# Patient Record
Sex: Female | Born: 1970 | Race: Black or African American | Hispanic: No | Marital: Single | Smoking: Never smoker
Health system: Southern US, Community
[De-identification: ages and names within clinical notes are randomized; demographics above are authoritative.]

## PROBLEM LIST (undated history)

## (undated) DIAGNOSIS — D219 Benign neoplasm of connective and other soft tissue, unspecified: Secondary | ICD-10-CM

## (undated) DIAGNOSIS — K5900 Constipation, unspecified: Secondary | ICD-10-CM

---

## 1999-10-13 DIAGNOSIS — R002 Palpitations: Secondary | ICD-10-CM

## 1999-10-13 HISTORY — DX: Palpitations: R00.2

## 2006-01-19 ENCOUNTER — Encounter: Admission: RE | Admit: 2006-01-19 | Discharge: 2006-01-19 | Payer: Self-pay

## 2007-04-13 ENCOUNTER — Encounter: Admission: RE | Admit: 2007-04-13 | Discharge: 2007-04-13 | Payer: Self-pay | Admitting: Internal Medicine

## 2008-08-16 ENCOUNTER — Encounter: Admission: RE | Admit: 2008-08-16 | Discharge: 2008-08-16 | Payer: Self-pay | Admitting: Internal Medicine

## 2009-09-15 ENCOUNTER — Emergency Department (HOSPITAL_COMMUNITY): Admission: EM | Admit: 2009-09-15 | Discharge: 2009-09-15 | Payer: Self-pay | Admitting: Emergency Medicine

## 2009-11-18 ENCOUNTER — Other Ambulatory Visit: Admission: RE | Admit: 2009-11-18 | Discharge: 2009-11-18 | Payer: Self-pay | Admitting: Internal Medicine

## 2010-03-23 ENCOUNTER — Emergency Department (HOSPITAL_COMMUNITY): Admission: EM | Admit: 2010-03-23 | Discharge: 2010-03-23 | Payer: Self-pay | Admitting: Emergency Medicine

## 2010-07-25 ENCOUNTER — Emergency Department (HOSPITAL_COMMUNITY): Admission: EM | Admit: 2010-07-25 | Discharge: 2010-07-25 | Payer: Self-pay | Admitting: Emergency Medicine

## 2010-12-22 ENCOUNTER — Emergency Department (HOSPITAL_COMMUNITY): Payer: Self-pay

## 2010-12-22 ENCOUNTER — Emergency Department (HOSPITAL_COMMUNITY)
Admission: EM | Admit: 2010-12-22 | Discharge: 2010-12-23 | Disposition: A | Discharge status: Home / Self Care | Payer: Self-pay | Attending: Emergency Medicine | Admitting: Emergency Medicine

## 2010-12-22 DIAGNOSIS — R0989 Other specified symptoms and signs involving the circulatory and respiratory systems: Secondary | ICD-10-CM | POA: Insufficient documentation

## 2010-12-22 DIAGNOSIS — R0609 Other forms of dyspnea: Secondary | ICD-10-CM | POA: Insufficient documentation

## 2010-12-22 DIAGNOSIS — D649 Anemia, unspecified: Secondary | ICD-10-CM | POA: Insufficient documentation

## 2010-12-22 DIAGNOSIS — F411 Generalized anxiety disorder: Secondary | ICD-10-CM | POA: Insufficient documentation

## 2010-12-22 DIAGNOSIS — R002 Palpitations: Secondary | ICD-10-CM | POA: Insufficient documentation

## 2010-12-22 LAB — DIFFERENTIAL
Basophils Absolute: 0 10*3/uL (ref 0.0–0.1)
Eosinophils Absolute: 0.1 10*3/uL (ref 0.0–0.7)
Monocytes Relative: 8 % (ref 3–12)
Neutrophils Relative %: 44 % (ref 43–77)

## 2010-12-22 LAB — BASIC METABOLIC PANEL
BUN: 12 mg/dL (ref 6–23)
Calcium: 8.9 mg/dL (ref 8.4–10.5)
Chloride: 105 mEq/L (ref 96–112)
Creatinine, Ser: 0.62 mg/dL (ref 0.4–1.2)
GFR calc non Af Amer: >60 mL/min (ref >60)
Potassium: 3.8 mEq/L (ref 3.5–5.1)
Sodium: 136 mEq/L (ref 135–145)

## 2010-12-22 LAB — URINALYSIS, ROUTINE W REFLEX MICROSCOPIC
Bilirubin Urine: NEGATIVE
Glucose, UA: NEGATIVE mg/dL
Nitrite: NEGATIVE
Protein, ur: NEGATIVE mg/dL
Urobilinogen, UA: 1 mg/dL (ref 0.0–1.0)
pH: 7 (ref 5.0–8.0)

## 2010-12-22 LAB — POCT CARDIAC MARKERS
CKMB, poc: <1 ng/mL — ABNORMAL LOW (ref 1.0–8.0)
Myoglobin, poc: 30.3 ng/mL (ref 12–200)

## 2010-12-22 LAB — RAPID URINE DRUG SCREEN, HOSP PERFORMED: Cocaine: NOT DETECTED

## 2010-12-22 LAB — CBC
MCH: 26.7 pg (ref 26.0–34.0)
Platelets: 278 10*3/uL (ref 150–400)
RDW: 16 % — ABNORMAL HIGH (ref 11.5–15.5)
WBC: 4.7 10*3/uL (ref 4.0–10.5)

## 2010-12-25 LAB — CBC
Hemoglobin: 10.7 g/dL — ABNORMAL LOW (ref 12.0–15.0)
Platelets: 345 10*3/uL (ref 150–400)

## 2010-12-25 LAB — BASIC METABOLIC PANEL
CO2: 27 mEq/L (ref 19–32)
Calcium: 9.4 mg/dL (ref 8.4–10.5)
Chloride: 109 mEq/L (ref 96–112)
GFR calc Af Amer: >60 mL/min (ref >60)
Glucose, Bld: 92 mg/dL (ref 70–99)
Potassium: 4.4 mEq/L (ref 3.5–5.1)
Sodium: 139 mEq/L (ref 135–145)

## 2010-12-25 LAB — DIFFERENTIAL
Basophils Absolute: 0 10*3/uL (ref 0.0–0.1)
Eosinophils Absolute: 0.3 10*3/uL (ref 0.0–0.7)
Lymphocytes Relative: 47 % — ABNORMAL HIGH (ref 12–46)
Monocytes Absolute: 0.5 10*3/uL (ref 0.1–1.0)
Monocytes Relative: 10 % (ref 3–12)
Neutro Abs: 2.1 10*3/uL (ref 1.7–7.7)
Neutrophils Relative %: 38 % — ABNORMAL LOW (ref 43–77)

## 2010-12-25 LAB — URINALYSIS, ROUTINE W REFLEX MICROSCOPIC
Glucose, UA: NEGATIVE mg/dL
Protein, ur: NEGATIVE mg/dL
Urobilinogen, UA: 1 mg/dL (ref 0.0–1.0)
pH: 8 (ref 5.0–8.0)

## 2010-12-25 LAB — POCT PREGNANCY, URINE: Preg Test, Ur: NEGATIVE

## 2010-12-29 LAB — POCT CARDIAC MARKERS
CKMB, poc: <1 ng/mL — ABNORMAL LOW (ref 1.0–8.0)
Myoglobin, poc: 24.9 ng/mL (ref 12–200)
Troponin i, poc: <0.05 ng/mL (ref 0.00–0.09)

## 2010-12-29 LAB — POCT I-STAT, CHEM 8
Calcium, Ion: 1.22 mmol/L (ref 1.12–1.32)
Glucose, Bld: 74 mg/dL (ref 70–99)
HCT: 34 % — ABNORMAL LOW (ref 36.0–46.0)
Hemoglobin: 11.6 g/dL — ABNORMAL LOW (ref 12.0–15.0)
Sodium: 140 mEq/L (ref 135–145)
TCO2: 23 mmol/L (ref 0–100)

## 2010-12-29 LAB — CBC
Hemoglobin: 10.8 g/dL — ABNORMAL LOW (ref 12.0–15.0)
MCV: 85.9 fL (ref 78.0–100.0)
Platelets: 262 10*3/uL (ref 150–400)
RBC: 3.69 MIL/uL — ABNORMAL LOW (ref 3.87–5.11)
RDW: 16.9 % — ABNORMAL HIGH (ref 11.5–15.5)

## 2010-12-29 LAB — DIFFERENTIAL
Basophils Relative: 1 % (ref 0–1)
Eosinophils Relative: 5 % (ref 0–5)
Monocytes Relative: 11 % (ref 3–12)
Neutrophils Relative %: 42 % — ABNORMAL LOW (ref 43–77)

## 2011-01-13 LAB — HIV ANTIBODY (ROUTINE TESTING W REFLEX): HIV: NONREACTIVE

## 2011-01-13 LAB — DIFFERENTIAL
Eosinophils Absolute: 0.2 10*3/uL (ref 0.0–0.7)
Eosinophils Relative: 4 % (ref 0–5)
Lymphs Abs: 1.4 10*3/uL (ref 0.7–4.0)
Monocytes Relative: 12 % (ref 3–12)

## 2011-01-13 LAB — PREGNANCY, URINE: Preg Test, Ur: NEGATIVE

## 2011-01-13 LAB — COMPREHENSIVE METABOLIC PANEL
AST: 21 U/L (ref 0–37)
Alkaline Phosphatase: 41 U/L (ref 39–117)
BUN: 11 mg/dL (ref 6–23)
Creatinine, Ser: 0.69 mg/dL (ref 0.4–1.2)
Glucose, Bld: 90 mg/dL (ref 70–99)
Sodium: 134 mEq/L — ABNORMAL LOW (ref 135–145)

## 2011-01-13 LAB — CBC
Hemoglobin: 11.6 g/dL — ABNORMAL LOW (ref 12.0–15.0)
MCHC: 34 g/dL (ref 30.0–36.0)
MCV: 87.4 fL (ref 78.0–100.0)
RDW: 15.2 % (ref 11.5–15.5)

## 2011-01-13 LAB — URINALYSIS, ROUTINE W REFLEX MICROSCOPIC
Glucose, UA: NEGATIVE mg/dL
Hgb urine dipstick: NEGATIVE
Specific Gravity, Urine: 1.022 (ref 1.005–1.030)

## 2011-01-13 LAB — TSH: TSH: 0.745 u[IU]/mL (ref 0.350–4.500)

## 2011-01-13 LAB — URINE CULTURE
Colony Count: NO GROWTH
Culture: NO GROWTH

## 2011-01-13 LAB — T4, FREE: Free T4: 1.03 ng/dL (ref 0.80–1.80)

## 2011-02-20 ENCOUNTER — Other Ambulatory Visit: Payer: Self-pay | Admitting: Internal Medicine

## 2011-02-20 DIAGNOSIS — N63 Unspecified lump in unspecified breast: Secondary | ICD-10-CM

## 2011-02-26 ENCOUNTER — Other Ambulatory Visit: Payer: Self-pay

## 2019-07-21 ENCOUNTER — Observation Stay (HOSPITAL_COMMUNITY)
Admission: EM | Admit: 2019-07-21 | Discharge: 2019-07-22 | Disposition: A | Discharge status: Home / Self Care | Payer: BC Managed Care – PPO | Attending: Internal Medicine | Admitting: Internal Medicine

## 2019-07-21 ENCOUNTER — Encounter (HOSPITAL_COMMUNITY): Payer: Self-pay | Admitting: *Deleted

## 2019-07-21 ENCOUNTER — Other Ambulatory Visit: Payer: Self-pay

## 2019-07-21 DIAGNOSIS — N920 Excessive and frequent menstruation with regular cycle: Secondary | ICD-10-CM

## 2019-07-21 DIAGNOSIS — K5900 Constipation, unspecified: Secondary | ICD-10-CM | POA: Insufficient documentation

## 2019-07-21 DIAGNOSIS — D649 Anemia, unspecified: Secondary | ICD-10-CM | POA: Diagnosis not present

## 2019-07-21 DIAGNOSIS — Z20828 Contact with and (suspected) exposure to other viral communicable diseases: Secondary | ICD-10-CM | POA: Diagnosis not present

## 2019-07-21 DIAGNOSIS — D259 Leiomyoma of uterus, unspecified: Secondary | ICD-10-CM | POA: Diagnosis not present

## 2019-07-21 DIAGNOSIS — R011 Cardiac murmur, unspecified: Secondary | ICD-10-CM | POA: Diagnosis not present

## 2019-07-21 DIAGNOSIS — K5909 Other constipation: Secondary | ICD-10-CM

## 2019-07-21 HISTORY — DX: Constipation, unspecified: K59.00

## 2019-07-21 HISTORY — DX: Benign neoplasm of connective and other soft tissue, unspecified: D21.9

## 2019-07-21 LAB — VITAMIN B12: Vitamin B-12: 257 pg/mL (ref 180–914)

## 2019-07-21 LAB — COMPREHENSIVE METABOLIC PANEL
ALT: 12 U/L (ref 0–44)
AST: 15 U/L (ref 15–41)
Albumin: 4.1 g/dL (ref 3.5–5.0)
Alkaline Phosphatase: 30 U/L — ABNORMAL LOW (ref 38–126)
Anion gap: 10 (ref 5–15)
BUN: 9 mg/dL (ref 6–20)
CO2: 18 mmol/L — ABNORMAL LOW (ref 22–32)
Calcium: 9.3 mg/dL (ref 8.9–10.3)
Chloride: 109 mmol/L (ref 98–111)
Creatinine, Ser: 0.62 mg/dL (ref 0.44–1.00)
GFR calc Af Amer: >60 mL/min (ref >60)
GFR calc non Af Amer: >60 mL/min (ref >60)
Glucose, Bld: 89 mg/dL (ref 70–99)
Potassium: 3.6 mmol/L (ref 3.5–5.1)
Sodium: 137 mmol/L (ref 135–145)
Total Bilirubin: 0.2 mg/dL — ABNORMAL LOW (ref 0.3–1.2)
Total Protein: 7.3 g/dL (ref 6.5–8.1)

## 2019-07-21 LAB — I-STAT BETA HCG BLOOD, ED (MC, WL, AP ONLY): I-stat hCG, quantitative: <5 m[IU]/mL (ref <5)

## 2019-07-21 LAB — RETICULOCYTES
Immature Retic Fract: 19.9 % — ABNORMAL HIGH (ref 2.3–15.9)
RBC.: 3.5 MIL/uL — ABNORMAL LOW (ref 3.87–5.11)
Retic Count, Absolute: 26.6 10*3/uL (ref 19.0–186.0)
Retic Ct Pct: 0.8 % (ref 0.4–3.1)

## 2019-07-21 LAB — IRON AND TIBC
Iron: 10 ug/dL — ABNORMAL LOW (ref 28–170)
Saturation Ratios: 2 % — ABNORMAL LOW (ref 10.4–31.8)
TIBC: 542 ug/dL — ABNORMAL HIGH (ref 250–450)
UIBC: 532 ug/dL

## 2019-07-21 LAB — SARS CORONAVIRUS 2 (TAT 6-24 HRS): SARS Coronavirus 2: NEGATIVE

## 2019-07-21 LAB — FERRITIN: Ferritin: 2 ng/mL — ABNORMAL LOW (ref 11–307)

## 2019-07-21 LAB — FOLATE: Folate: 11 ng/mL (ref >5.9)

## 2019-07-21 LAB — PREPARE RBC (CROSSMATCH)

## 2019-07-21 LAB — ABO/RH: ABO/RH(D): A POS

## 2019-07-21 LAB — POC OCCULT BLOOD, ED: Fecal Occult Bld: NEGATIVE

## 2019-07-21 MED ORDER — ENOXAPARIN SODIUM 40 MG/0.4ML ~~LOC~~ SOLN
40.0000 mg | SUBCUTANEOUS | Status: DC
  Administered 2019-07-22: 01:00:00 40 mg via SUBCUTANEOUS
  Filled 2019-07-21: qty 0.4

## 2019-07-21 MED ORDER — SODIUM CHLORIDE 0.9 % IV SOLN
510.0000 mg | Freq: Once | INTRAVENOUS | Status: AC
  Administered 2019-07-22: 510 mg via INTRAVENOUS
  Filled 2019-07-21: qty 17

## 2019-07-21 MED ORDER — SODIUM CHLORIDE 0.9% FLUSH
3.0000 mL | Freq: Two times a day (BID) | INTRAVENOUS | Status: DC
  Administered 2019-07-21: 3 mL via INTRAVENOUS

## 2019-07-21 MED ORDER — ACETAMINOPHEN 650 MG RE SUPP: 650.0000 mg | Freq: Four times a day (QID) | RECTAL | Status: DC | PRN

## 2019-07-21 MED ORDER — ENOXAPARIN SODIUM 40 MG/0.4ML ~~LOC~~ SOLN: 40.0000 mg | SUBCUTANEOUS | Status: DC

## 2019-07-21 MED ORDER — ACETAMINOPHEN 325 MG PO TABS: 650.0000 mg | ORAL_TABLET | Freq: Four times a day (QID) | ORAL | Status: DC | PRN

## 2019-07-21 MED ORDER — MAGNESIUM HYDROXIDE 400 MG/5ML PO SUSP: 30.0000 mL | Freq: Every day | ORAL | Status: DC | PRN

## 2019-07-21 MED ORDER — SODIUM CHLORIDE 0.9 % IV SOLN
10.0000 mL/h | Freq: Once | INTRAVENOUS | Status: AC
  Administered 2019-07-21: 18:00:00 10 mL/h via INTRAVENOUS

## 2019-07-21 NOTE — ED Triage Notes (Signed)
Pt sent here by pcp for abnormal labs.  Told hgb 5.5.  States heavy menses x 7 years.  Denies blood in stool.

## 2019-07-21 NOTE — ED Provider Notes (Addendum)
[facility] Provider Note   CSN: [id number] Arrival date & time: 07/21/19  1459     History   Chief Complaint Chief Complaint  Patient presents with  . Abnormal Lab    hgb 5    HPI Meagan Kerr is a 48 y.o. female.     HPI  48 year old female presents with anemia.  Sent over by Dr. Collene Mares after being evaluated for constipation and having labs drawn.  Hemoglobin came back in the 5 range.  Patient tells me that she is had chronic fatigue and occasionally is lightheaded when standing.  This is not particularly worse today.  No abdominal pain.  The constipation is also a subacute to chronic problem.  Months ago she has had some blood when having bowel movement but not recently.  No melena.  No abdominal pain.  She does have heavy menstrual cycles and has been having this for many years.  7 years ago she was diagnosed with fibroid uterus.  Last menstrual period was September 10.  Past Medical History:  Diagnosis Date  . Constipation   . Fibroid     There are no active problems to display for this patient.   History reviewed. No pertinent surgical history.   OB History   No obstetric history on file.      Home Medications    Prior to Admission medications   Not on File    Family History No family history on file.  Social History Social History   Tobacco Use  . Smoking status: Never Smoker  . Smokeless tobacco: Never Used  Substance Use Topics  . Alcohol use: Never    Frequency: Never  . Drug use: Not on file     Allergies   Patient has no known allergies.   Review of Systems Review of Systems  Constitutional: Positive for fatigue. Negative for fever.  Respiratory: Negative for shortness of breath.   Cardiovascular: Negative for chest pain.  Gastrointestinal: Positive for constipation. Negative for abdominal pain, blood in stool and vomiting.  Genitourinary: Positive for vaginal bleeding.  Neurological: Positive  for light-headedness.  All other systems reviewed and are negative.    Physical Exam Updated Vital Signs BP 120/74   Pulse 84   Temp 99.1 F (37.3 C) (Oral)   Resp 16   Ht 5\' 4"  (1.626 m)   Wt 79.8 kg   LMP 06/22/2019   SpO2 100%   BMI 30.21 kg/m   Physical Exam Vitals signs and nursing note reviewed. Exam conducted with a chaperone present.  Constitutional:      Appearance: She is well-developed.  HENT:     Head: Normocephalic and atraumatic.     Right Ear: External ear normal.     Left Ear: External ear normal.     Nose: Nose normal.  Eyes:     General:        Right eye: No discharge.        Left eye: No discharge.  Cardiovascular:     Rate and Rhythm: Normal rate and regular rhythm.     Heart sounds: Murmur present.  Pulmonary:     Effort: Pulmonary effort is normal.     Breath sounds: Normal breath sounds.  Abdominal:     General: There is no distension.     Palpations: Abdomen is soft.     Tenderness: There is no abdominal tenderness.  Genitourinary:    Rectum: Guaiac result negative.     Comments:  No hemorrhoids, masses or tenderness on rectal exam. No gross blood. Skin:    General: Skin is warm and dry.  Neurological:     Mental Status: She is alert.  Psychiatric:        Mood and Affect: Mood is not anxious.      ED Treatments / Results  Labs (all labs ordered are listed, but only abnormal results are displayed) Labs Reviewed  COMPREHENSIVE METABOLIC PANEL - Abnormal; Notable for the following components:      Result Value   CO2 18 (*)    Alkaline Phosphatase 30 (*)    Total Bilirubin 0.2 (*)    All other components within normal limits  CBC - Abnormal; Notable for the following components:   RBC 3.47 (*)    Hemoglobin 5.4 (*)    HCT 20.9 (*)    MCV 60.2 (*)    MCH 15.6 (*)    MCHC 25.8 (*)    RDW 22.6 (*)    All other components within normal limits  SARS CORONAVIRUS 2 (TAT 6-24 HRS)  VITAMIN B12  FOLATE  IRON AND TIBC  FERRITIN   RETICULOCYTES  POC OCCULT BLOOD, ED  I-STAT BETA HCG BLOOD, ED ([facility], [facility], [facility] ONLY)  TYPE AND SCREEN  PREPARE RBC (CROSSMATCH)  ABO/RH    EKG None  Radiology No results found.  Procedures .Critical Care Performed by: Sherwood Gambler, MD Authorized by: Sherwood Gambler, MD   Critical care provider statement:    Critical care time (minutes):  30   Critical care time was exclusive of:  Separately billable procedures and treating other patients   Critical care was necessary to treat or prevent imminent or life-threatening deterioration of the following conditions:  Circulatory failure   Critical care was time spent personally by me on the following activities:  Discussions with consultants, evaluation of patient's response to treatment, examination of patient, ordering and performing treatments and interventions, ordering and review of laboratory studies, ordering and review of radiographic studies, pulse oximetry, re-evaluation of patient's condition, obtaining history from patient or surrogate and review of old charts   (including critical care time)  Medications Ordered in ED Medications  0.9 %  sodium chloride infusion (has no administration in time range)     Initial Impression / Assessment and Plan / ED Course  I have reviewed the triage vital signs and the nursing notes.  Pertinent labs & imaging results that were available during my care of the patient were reviewed by me and considered in my medical decision making (see chart for details).        Patient's blood work here confirms significant anemia with hemoglobin of 5.4.  Likely this is from heavy menstrual cycles.  No obvious GI bleeding on history or exam.  We will also send anemia panel but I think given the depth of her hemoglobin, she should get transfusion and observation.  I did discuss with OB/GYN, Dr. Rip Harbour, who advises that she can get an ultrasound if she has not had one in a while but otherwise OB/GYN would  be able to see her as an outpatient but not much for them to do in the hospital.  Internal medicine teaching service consulted for admission. Murmur is probably from anemia  Meagan Kerr was evaluated in Emergency Department on 07/21/2019 for the symptoms described in the history of present illness. She was evaluated in the context of the global COVID-19 pandemic, which necessitated consideration that the patient might be at risk  for infection with the SARS-CoV-2 virus that causes COVID-19. Institutional protocols and algorithms that pertain to the evaluation of patients at risk for COVID-19 are in a state of rapid change based on information released by regulatory bodies including the CDC and federal and state organizations. These policies and algorithms were followed during the patient's care in the ED.   Final Clinical Impressions(s) / ED Diagnoses   Final diagnoses:  Symptomatic anemia    ED Discharge Orders    None       Sherwood Gambler, MD 07/21/19 1727    Sherwood Gambler, MD 07/21/19 1739

## 2019-07-21 NOTE — H&P (Signed)
Date: 07/21/2019               Patient Name:  Meagan Kerr MRN: [id number]  DOB: 03/11/1971 Age / Sex: 48 y.o., female   PCP: Patient, No Pcp Per         Medical Service: Internal Medicine Teaching Service         Attending Physician: Dr. Sherwood Gambler, MD    First Contact: Dr. Gilford Rile Pager: [phone]  Second Contact: Dr. Myrtie Hawk Pager: [phone]       After Hours (After 5p/  First Contact Pager: [phone]  weekends / holidays): Second Contact Pager: [phone]   Chief Complaint: "Low Hemoglobin"   History of Present Illness:  [name] is a 48 y/o female, with a PMH of fibroids and constipation, who presents to [facility] after being referred by her GI doctor for having a hemoglobin ranging in the 5s. She states that she has been having increased fatigue for the past several months. She initially attributed this fatigue to her constipation, which she has had for ten years, and if she does not take a laxative, she can go a month and half without a BM. She has noticed that she gets lightheaded when taking hot showers, and standing up over the past few months. She states that she feels herself today, and that the only modification she has had recently was starting Trulance by Dr. Collene Mares. She denies palpitations, vertigo, nausea, vomiting, abdominal pain, fevers, myalgias, and chest pain.   Menstrual Hx:  Heavy periods lasting 5 and a half to six days. Occur monthly. Typically uses 6 heavy pads per day. LMP 06/22/2019. Denies having period at time of interview.   During her ED course it was found that she a hemoglobin of 5.4 and was transfused with 2 units of blood. Her FOBT was negative. Her iron studies showed a Iron of 10, TIBC of 542, a ferritin of 2, and a immature retic fract of 19.9.   Meds: Current Meds  Medication Sig  . Plecanatide (TRULANCE) 3 MG TABS Take 1 tablet by mouth daily.     Allergies: Allergies as of 07/21/2019  . (No Known Allergies)   Past Medical History:   Diagnosis Date  . Constipation   . Fibroid     Family History:  - No family history of HTN, DM, or cancer.   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  - States her diet could be better. She was nonspecific on details, but most recently finished a "water cleanse."  - Denies tobacco, EtOH, or illicit drug usage.    Review of Systems: A complete ROS was negative except as per HPI.  Physical Exam: Blood pressure 120/74, pulse 84, temperature 99.1 F (37.3 C), temperature source Oral, resp. rate 16, height 5\' 4"  (1.626 m), weight 79.8 kg, last menstrual period 06/22/2019, SpO2 100 %.  Physical Exam Constitutional:      General: She is not in acute distress.    Appearance: Normal appearance. She is not ill-appearing, toxic-appearing or diaphoretic.  HENT:     Head: Normocephalic and atraumatic.  Eyes:     Comments: Conjunctival pallor noted bilaterally.   Cardiovascular:     Rate and Rhythm: Normal rate and regular rhythm.     Pulses: Normal pulses.     Heart sounds: Murmur present. No friction rub. No gallop.      Comments: Grade III systolic murmur  auscultated throughout all cardiac post.  Pulmonary:     Effort: Pulmonary effort is normal.     Breath sounds: Normal breath sounds. No wheezing, rhonchi or rales.  Abdominal:     General: Bowel sounds are normal.     Palpations: Abdomen is soft.     Tenderness: There is no abdominal tenderness. There is no guarding.  Musculoskeletal:     Right lower leg: Edema present.     Left lower leg: Edema present.     Comments: Trace 1+ pitting edema noted bilaterally in the lower extremities.   Skin:    General: Skin is warm.  Neurological:     Mental Status: She is alert and oriented to person, place, and time.  Psychiatric:        Mood and Affect: Mood normal.        Behavior: Behavior normal.    Assessment & Plan by Problem: Active Problems:   * No active  hospital problems. *  Assessment:  [name] is a 48 y/o female, with a PMH significant for uterine fibroids and constipation, presents to the ED with symptomatic anemia.   [name] presents to the ED after being referred by her GI doctor for a Hg in the 5s. She presented to [facility] where her Hgb was found to be 5.4. Her review of systems showed that she does get light headed, and has noticed slow onset fatigue for the past several months.  Her physical exam was significant for a grade III systolic murmur, conjunctival pallor, and trace pitting edema, which may lead credence to chronic anemia.   She does have several potential causes for her underlying anemia. She has a history of heavy periods with her last one being on 07/21/2019. These heavy periods could be compounded by her PMH of uterine fibroids. She also endorses a 10 year history of constipation with the longest time between unprovoked bowel movements being a month. There may be a possible malabsorption underlying etiology. She is seeing a GI physician for her constipation and started on Trulance today.    Plan:  Subacute Symptomatic Anemia:  - Hg: 5.4 - FOBT: negative - transfused with 2 units of blood. - Continue monitoring CBC - Follow Up on Folate level  - Continue to monitor vitals - Follow creatinine  Constipation:  - Continue Trulance - Milk of Magnesia PRN - TSH level ordered  Code Status: Full Diet: Regular DVT ppx: Lovenox  Dispo: Admit patient to Observation with expected length of stay less than 2 midnights.  Signed: Maudie Mercury, MD 07/21/2019, 6:17 PM  Pager: [phone]

## 2019-07-21 NOTE — Progress Notes (Signed)
Patient is receiving second blood transfusion and tolerating well. No signs of reaction and is resting comfortably in bed. Will continue to monitor.

## 2019-07-21 NOTE — ED Notes (Signed)
Dr Regenia Skeeter notified of ghb of 5.4

## 2019-07-21 NOTE — ED Notes (Signed)
Admitting MD at bedside.

## 2019-07-22 ENCOUNTER — Observation Stay (HOSPITAL_COMMUNITY): Payer: BC Managed Care – PPO

## 2019-07-22 DIAGNOSIS — N920 Excessive and frequent menstruation with regular cycle: Secondary | ICD-10-CM

## 2019-07-22 DIAGNOSIS — D649 Anemia, unspecified: Secondary | ICD-10-CM

## 2019-07-22 DIAGNOSIS — Z9889 Other specified postprocedural states: Secondary | ICD-10-CM

## 2019-07-22 DIAGNOSIS — D259 Leiomyoma of uterus, unspecified: Secondary | ICD-10-CM

## 2019-07-22 DIAGNOSIS — K5900 Constipation, unspecified: Secondary | ICD-10-CM | POA: Diagnosis not present

## 2019-07-22 DIAGNOSIS — R011 Cardiac murmur, unspecified: Secondary | ICD-10-CM

## 2019-07-22 LAB — CBC
HCT: 20.9 % — ABNORMAL LOW (ref 36.0–46.0)
HCT: 25.4 % — ABNORMAL LOW (ref 36.0–46.0)
HCT: 25.3 % — ABNORMAL LOW (ref 36.0–46.0)
Hemoglobin: 5.4 g/dL — CL (ref 12.0–15.0)
Hemoglobin: 7.4 g/dL — ABNORMAL LOW (ref 12.0–15.0)
Hemoglobin: 7.2 g/dL — ABNORMAL LOW (ref 12.0–15.0)
MCH: 15.6 pg — ABNORMAL LOW (ref 26.0–34.0)
MCH: 19 pg — ABNORMAL LOW (ref 26.0–34.0)
MCH: 18.5 pg — ABNORMAL LOW (ref 26.0–34.0)
MCHC: 25.8 g/dL — ABNORMAL LOW (ref 30.0–36.0)
MCHC: 29.1 g/dL — ABNORMAL LOW (ref 30.0–36.0)
MCHC: 28.5 g/dL — ABNORMAL LOW (ref 30.0–36.0)
MCV: 60.2 fL — ABNORMAL LOW (ref 80.0–100.0)
MCV: 65.1 fL — ABNORMAL LOW (ref 80.0–100.0)
MCV: 64.9 fL — ABNORMAL LOW (ref 80.0–100.0)
Platelets: 351 10*3/uL (ref 150–400)
Platelets: 312 10*3/uL (ref 150–400)
Platelets: 321 10*3/uL (ref 150–400)
RBC: 3.47 MIL/uL — ABNORMAL LOW (ref 3.87–5.11)
RBC: 3.9 MIL/uL (ref 3.87–5.11)
RBC: 3.9 MIL/uL (ref 3.87–5.11)
RDW: 22.6 % — ABNORMAL HIGH (ref 11.5–15.5)
RDW: 28.1 % — ABNORMAL HIGH (ref 11.5–15.5)
RDW: 27.8 % — ABNORMAL HIGH (ref 11.5–15.5)
WBC: 5.4 10*3/uL (ref 4.0–10.5)
WBC: 11.9 10*3/uL — ABNORMAL HIGH (ref 4.0–10.5)
WBC: 7.8 10*3/uL (ref 4.0–10.5)
nRBC: 0 % (ref 0.0–0.2)
nRBC: 0 % (ref 0.0–0.2)
nRBC: 0 % (ref 0.0–0.2)

## 2019-07-22 LAB — BASIC METABOLIC PANEL
Anion gap: 11 (ref 5–15)
BUN: 10 mg/dL (ref 6–20)
CO2: 19 mmol/L — ABNORMAL LOW (ref 22–32)
Calcium: 8.9 mg/dL (ref 8.9–10.3)
Chloride: 109 mmol/L (ref 98–111)
Creatinine, Ser: 0.6 mg/dL (ref 0.44–1.00)
GFR calc Af Amer: >60 mL/min (ref >60)
GFR calc non Af Amer: >60 mL/min (ref >60)
Glucose, Bld: 87 mg/dL (ref 70–99)
Potassium: 3.6 mmol/L (ref 3.5–5.1)
Sodium: 139 mmol/L (ref 135–145)

## 2019-07-22 LAB — TYPE AND SCREEN
ABO/RH(D): A POS
Antibody Screen: NEGATIVE
Unit division: 0
Unit division: 0

## 2019-07-22 LAB — BPAM RBC
Blood Product Expiration Date: 202011012359
Blood Product Expiration Date: 202011032359
ISSUE DATE / TIME: 202010091809
ISSUE DATE / TIME: 202010092243
Unit Type and Rh: 6200
Unit Type and Rh: 6200

## 2019-07-22 LAB — TSH: TSH: 1.036 u[IU]/mL (ref 0.350–4.500)

## 2019-07-22 LAB — HIV ANTIBODY (ROUTINE TESTING W REFLEX): HIV Screen 4th Generation wRfx: NONREACTIVE

## 2019-07-22 MED ORDER — SODIUM CHLORIDE 0.9 % IV BOLUS
500.0000 mL | Freq: Once | INTRAVENOUS | Status: AC
  Administered 2019-07-22: 500 mL via INTRAVENOUS

## 2019-07-22 NOTE — Progress Notes (Signed)
Patient not experiencing any dizziness when up to bathroom. Did just state that she is having some soreness in left arm that her IV is in, but not other symptoms. No numbness or tingling and sensation intact. Gave heating packs to see if that relieves the soreness.

## 2019-07-22 NOTE — Plan of Care (Signed)
  Problem: Safety: Goal: Ability to remain free from injury will improve Outcome: Progressing   

## 2019-07-22 NOTE — Progress Notes (Signed)
Discharge paperwork and instructions given to pt. Pt not in distress and tolerated well. 

## 2019-07-22 NOTE — Progress Notes (Signed)
Patients BP started to trend down after 2nd transfusion given. Not experiencing any lightheadedness/dizzinees, no headache or chest pain. Lungs clear on ascultation. MD made aware and order for bolus placed. No signs of active bleeding at the moment.  Will continue to monitor.

## 2019-07-22 NOTE — Progress Notes (Signed)
Pt's transportation has arrived; pt dressed, belongings gathered, and pt escorted to lobby for D/C.

## 2019-07-22 NOTE — Progress Notes (Addendum)
   Subjective:  [name] was seen in bed this morning. She states that she is feeling fine and "blessed." We spoke about her meeting with an outpatient OBGYN for management of her fibroids. We spoke about receiving a transvaginal ultrasound today so her OBGYN will have the imaging beforehand. We also spoke about coming to our clinic to monitor her hemoglobin. She was agreeable to the plan. All questions and concerns were addressed.   Objective:  Vital signs in last 24 hours: Vitals:   07/22/19 0350 07/22/19 0651 07/22/19 0734 07/22/19 0930  BP: (!) 98/55 96/60 103/67 108/60  Pulse: 70 71 66 77  Resp: 17 18 16 17   Temp: 98.6 F (37 C)  98.5 F (36.9 C) 97.9 F (36.6 C)  TempSrc: Oral  Oral Oral  SpO2: 100% 100% 100% 100%  Weight:      Height:       Physical Exam Vitals signs and nursing note reviewed.  Constitutional:      General: She is not in acute distress.    Appearance: Normal appearance. She is not ill-appearing, toxic-appearing or diaphoretic.  HENT:     Head: Normocephalic and atraumatic.  Cardiovascular:     Rate and Rhythm: Normal rate and regular rhythm.     Pulses: Normal pulses.     Heart sounds: Murmur present. No friction rub. No gallop.      Comments: Grade III systolic murmur Pulmonary:     Effort: Pulmonary effort is normal.     Breath sounds: Normal breath sounds. No wheezing, rhonchi or rales.  Abdominal:     General: Bowel sounds are normal. There is no distension.     Tenderness: There is no abdominal tenderness. There is no guarding.     Comments: Bowel sounds present to auscultation.  Firm, mass present in the LLQ  Skin:    General: Skin is warm.  Neurological:     Mental Status: She is alert and oriented to person, place, and time.  Psychiatric:        Mood and Affect: Mood normal.        Behavior: Behavior normal.     Assessment/Plan:  Active Problems:   Symptomatic anemia  Subacute Symptomatic Anemia:  - Hg: 7.4 S/P transfusion -  FOBT: negative - Continue monitoring CBC - Folate: 11 - Continue to monitor vitals - Creatinine: 0.60 - Transvaginal U/S ordered  Constipation:  - Continue Trulance - Milk of Magnesia PRN - TSH level ordered  Dispo: Anticipated discharge in approximately today.   Maudie Mercury, MD 07/22/2019, 12:58 PM Pager: [phone]

## 2019-07-22 NOTE — Plan of Care (Signed)
  Problem: Education: Goal: Knowledge of General Education information will improve Description: Including pain rating scale, medication(s)/side effects and non-pharmacologic comfort measures 07/22/2019 0122 by Cephus Shelling, RN Outcome: Progressing 07/22/2019 0122 by Cephus Shelling, RN Outcome: Progressing   Problem: Activity: Goal: Risk for activity intolerance will decrease Outcome: Progressing   Problem: Nutrition: Goal: Adequate nutrition will be maintained Outcome: Progressing   Problem: Elimination: Goal: Will not experience complications related to bowel motility Outcome: Progressing   Problem: Pain Managment: Goal: General experience of comfort will improve Outcome: Progressing   Problem: Safety: Goal: Ability to remain free from injury will improve Outcome: Progressing   Problem: Skin Integrity: Goal: Risk for impaired skin integrity will decrease Outcome: Progressing

## 2019-07-22 NOTE — Discharge Summary (Signed)
Name: Meagan Kerr MRN: [id number] DOB: [birth year] 48 y.o. PCP: Patient, No Pcp Per  Date of Admission: 07/21/2019  3:23 PM Date of Discharge: 07/22/2019 Attending Physician: No att. providers found  Discharge Diagnosis: 1. Symptomatic Anemia 2. Constipation 3. Fibroids  Discharge Medications: Allergies as of 07/22/2019   No Known Allergies     Medication List    TAKE these medications   Trulance 3 MG Tabs Generic drug: Plecanatide Take 1 tablet by mouth daily.       Disposition and follow-up:   Meagan Kerr was discharged from [facility] in Stable condition.  At the hospital follow up visit please address:  1.  Follow up Anemia with possible referral to OBGYN  2.  Labs / imaging needed at time of follow-up: N/A  3.  Pending labs/ test needing follow-up: CBC  Follow-up Appointments: Follow-up Information    [facility] Follow up.   Contact information: [address] [place] [facility] Course by problem list: 1. Symptomatic Anemia:  Patient presented to the ED after being referred by her GI doctor for a concerning hemoglobin level. She presented to [facility] where her Hgb was found to be 5.4. She endorsed recent fatigue, and shortness of breath.  Her physical exam was significant for a grade III systolic murmur, conjunctival pallor, and trace pitting edema, which may lead credence to chronic anemia. She received 2 units of blood with a repeat hemoglobin of 7.4. She was discharged after a repeat CBC showed her hemoglobin of 7.2, and will follow up with the [facility] clinic.   2. Constipation:  Patient came to the ED with complaints of 10 years of constipation, with a month between bowel movements if she does not provoke a bowel movement with teas or laxatives. She is followed by a GI practitioner who the day before admission prescribed a trial of Trulance. With her history of  fibroids and physical exam showing a mass in the LLQ, it is thought that her constipation could be due to her fibroid load. She was discharged with   3. Fibroids:  Patient has a history of Fibroids, which is thought to be the cause of her symptomatic anemia. During her stay, she underwent a transvaginal ultrasound which revealed multiple underlying fibroids with a Dominant R sided body lesion of approximately 14.2 cm. She was discharged with instructions to follow up/establish care with OBGYN.   Discharge Vitals:   BP (!) 112/58 (BP Location: Right Wrist)   Pulse 74   Temp 97.9 F (36.6 C)   Resp 17   Ht 5\' 4"  (1.626 m)   Wt 79.8 kg   LMP 06/22/2019   SpO2 100%   BMI 30.21 kg/m   Pertinent Labs, Studies, and Procedures:  FINDINGS: Uterus  Measurements: 18.4 x 13.4 x 16.4 cm = volume: 2121 mL. Diffusely heterogeneous, likely due to multiple underlying fibroids. A dominant right-sided body fibroid measures on the order of 14.2 x 10.3 x 10.8 cm.  Endometrium  Thickness: Poorly visualized secondary to altered uterine morphology. Felt to be identified at on the order of 8 mm on image 44 of cine 4.  Right ovary  Measurements: 3.8 x 2.4 x 2.0 cm = volume: 9.2 mL. Difficult to visualize secondary to uterine enlargement. No gross abnormality identified.  Left ovary  Measurements: 3.0 x 2.2 x 2.7 cm = volume: 9.6 mL. Difficult to visualize secondary to  uterine enlargement. No gross abnormality identified.  Other findings  No abnormal free fluid.  IMPRESSION: 1. Uterine enlargement and heterogeneity, likely due to underlying fibroids. Dominant right-sided body lesion of approximately 14.2 cm. 2. Suboptimal evaluation of the endometrium and ovaries set, secondary to uterine enlargement. The patient may benefit from outpatient pre and post-contrast pelvic MRI.   Ref Range & Units 4d ago (07/22/19) 4d ago (07/22/19) 5d ago (07/21/19) 49yr ago (12/22/10)  WBC 4.0  - 10.5 K/uL 7.8  11.9High   5.4  4.7   RBC 3.87 - 5.11 MIL/uL 3.90  3.90  3.47Low   3.78Low    Hemoglobin 12.0 - 15.0 g/dL 7.2Low   7.4Low  CM  5.4Low Panic  VC, CM       Discharge Instructions: Discharge Instructions    Call MD for:  difficulty breathing, headache or visual disturbances   Complete by: As directed    Call MD for:  extreme fatigue   Complete by: As directed    Call MD for:  persistant dizziness or light-headedness   Complete by: As directed    Call MD for:  persistant nausea and vomiting   Complete by: As directed    Call MD for:  redness, tenderness, or signs of infection (pain, swelling, redness, odor or green/yellow discharge around incision site)   Complete by: As directed    Call MD for:  severe uncontrolled pain   Complete by: As directed    Call MD for:  temperature >100.4   Complete by: As directed    Diet - low sodium heart healthy   Complete by: As directed    Increase activity slowly   Complete by: As directed       Signed: Maudie Mercury, MD 07/26/2019, 8:21 PM   Pager: [phone]

## 2019-07-22 NOTE — Discharge Instructions (Signed)
Thank you for choosing [facility] for your health care needs. You were diagnosed with symptomatic anemia and received 2 units of blood. While you were here you also elected for a transvaginal ultrasound to have ready for you next OBGYN appointment. Expect a call from our clinic to follow up your blood results. Also, please reach out to your OBGYN to go over your ultrasound. If you do not have one our clinic can refer you to one. Please come back if you notice shortness of breath, extreme fatigue, heart palpitations, dizziness, or acute changes in your general health.     Anemia  Anemia is a condition in which you do not have enough red blood cells or hemoglobin. Hemoglobin is a substance in red blood cells that carries oxygen. When you do not have enough red blood cells or hemoglobin (are anemic), your body cannot get enough oxygen and your organs may not work properly. As a result, you may feel very tired or have other problems. What are the causes? Common causes of anemia include:  Excessive bleeding. Anemia can be caused by excessive bleeding inside or outside the body, including bleeding from the intestine or from periods in women.  Poor nutrition.  Long-lasting (chronic) kidney, thyroid, and liver disease.  Bone marrow disorders.  Cancer and treatments for cancer.  HIV (human immunodeficiency virus) and AIDS (acquired immunodeficiency syndrome).  Treatments for HIV and AIDS.  Spleen problems.  Blood disorders.  Infections, medicines, and autoimmune disorders that destroy red blood cells. What are the signs or symptoms? Symptoms of this condition include:  Minor weakness.  Dizziness.  Headache.  Feeling heartbeats that are irregular or faster than normal (palpitations).  Shortness of breath, especially with exercise.  Paleness.  Cold sensitivity.  Indigestion.  Nausea.  Difficulty sleeping.  Difficulty concentrating. Symptoms may occur suddenly or develop  slowly. If your anemia is mild, you may not have symptoms. How is this diagnosed? This condition is diagnosed based on:  Blood tests.  Your medical history.  A physical exam.  Bone marrow biopsy. Your health care provider may also check your stool (feces) for blood and may do additional testing to look for the cause of your bleeding. You may also have other tests, including:  Imaging tests, such as a CT scan or MRI.  Endoscopy.  Colonoscopy. How is this treated? Treatment for this condition depends on the cause. If you continue to lose a lot of blood, you may need to be treated at a hospital. Treatment may include:  Taking supplements of iron, vitamin I43, or folic acid.  Taking a hormone medicine (erythropoietin) that can help to stimulate red blood cell growth.  Having a blood transfusion. This may be needed if you lose a lot of blood.  Making changes to your diet.  Having surgery to remove your spleen. Follow these instructions at home:  Take over-the-counter and prescription medicines only as told by your health care provider.  Take supplements only as told by your health care provider.  Follow any diet instructions that you were given.  Keep all follow-up visits as told by your health care provider. This is important. Contact a health care provider if:  You develop new bleeding anywhere in the body. Get help right away if:  You are very weak.  You are short of breath.  You have pain in your abdomen or chest.  You are dizzy or feel faint.  You have trouble concentrating.  You have bloody or black, tarry stools.  You vomit repeatedly or you vomit up blood. Summary  Anemia is a condition in which you do not have enough red blood cells or enough of a substance in your red blood cells that carries oxygen (hemoglobin).  Symptoms may occur suddenly or develop slowly.  If your anemia is mild, you may not have symptoms.  This condition is diagnosed with  blood tests as well as a medical history and physical exam. Other tests may be needed.  Treatment for this condition depends on the cause of the anemia. This information is not intended to replace advice given to you by your health care provider. Make sure you discuss any questions you have with your health care provider. Document Released: 11/05/2004 Document Revised: 09/10/2017 Document Reviewed: 10/30/2016 Elsevier Patient Education  2020 [place].    Menorrhagia Menorrhagia is when your menstrual periods are heavy or last longer than normal. Follow these instructions at home: Medicines   Take over-the-counter and prescription medicines exactly as told by your doctor. This includes iron pills.  Do not change or switch medicines without asking your doctor.  Do not take aspirin or medicines that contain aspirin 1 week before or during your period. Aspirin may make bleeding worse. General instructions  If you need to change your pad or tampon more than once every 2 hours, limit your activity until the bleeding stops.  Iron pills can cause problems when pooping (constipation). To prevent or treat pooping problems while taking prescription iron pills, your doctor may suggest that you: ? Drink enough fluid to keep your pee (urine) clear or pale yellow. ? Take over-the-counter or prescription medicines. ? Eat foods that are high in fiber. These foods include: ? Fresh fruits and vegetables. ? Whole grains. ? Beans. ? Limit foods that are high in fat and processed sugars. This includes fried and sweet foods.  Eat healthy meals and foods that are high in iron. Foods that have a lot of iron include: ? Leafy green vegetables. ? Meat. ? Liver. ? Eggs. ? Whole grain breads and cereals.  Do not try to lose weight until your heavy bleeding has stopped and you have normal amounts of iron in your blood. If you need to lose weight, work with your doctor.  Keep all follow-up visits as  told by your doctor. This is important. Contact a doctor if:  You soak through a pad or tampon every 1 or 2 hours, and this happens every time you have a period.  You need to use pads and tampons at the same time because you are bleeding so much.  You are taking medicine and you: ? Feel sick to your stomach (nauseous). ? Throw up (vomit). ? Have watery poop (diarrhea).  You have other problems that may be related to the medicine you are taking. Get help right away if:  You soak through more than a pad or tampon in 1 hour.  You pass clots bigger than 1 inch (2.5 cm) wide.  You feel short of breath.  You feel like your heart is beating too fast.  You feel dizzy or you pass out (faint).  You feel very weak or tired. Summary  Menorrhagia is when your menstrual periods are heavy or last longer than normal.  Take over-the-counter and prescription medicines exactly as told by your doctor. This includes iron pills.  Contact a doctor if you soak through more than a pad or tampon in 1 hour or are passing large clots. This information is not  intended to replace advice given to you by your health care provider. Make sure you discuss any questions you have with your health care provider. Document Released: 07/07/2008 Document Revised: 01/05/2018 Document Reviewed: 10/19/2016 Elsevier Patient Education  2020 [employer].

## 2019-07-25 ENCOUNTER — Other Ambulatory Visit: Payer: Self-pay

## 2019-07-25 ENCOUNTER — Ambulatory Visit (INDEPENDENT_AMBULATORY_CARE_PROVIDER_SITE_OTHER): Payer: BC Managed Care – PPO | Admitting: Internal Medicine

## 2019-07-25 ENCOUNTER — Encounter: Payer: Self-pay | Admitting: Internal Medicine

## 2019-07-25 VITALS — BP 131/75 | HR 65 | Temp 98.8°F | Ht 64.0 in | Wt 182.3 lb

## 2019-07-25 DIAGNOSIS — D649 Anemia, unspecified: Secondary | ICD-10-CM

## 2019-07-25 DIAGNOSIS — K5900 Constipation, unspecified: Secondary | ICD-10-CM

## 2019-07-25 DIAGNOSIS — N920 Excessive and frequent menstruation with regular cycle: Secondary | ICD-10-CM

## 2019-07-25 DIAGNOSIS — K5904 Chronic idiopathic constipation: Secondary | ICD-10-CM

## 2019-07-25 DIAGNOSIS — D259 Leiomyoma of uterus, unspecified: Secondary | ICD-10-CM | POA: Diagnosis not present

## 2019-07-25 MED ORDER — POLYSACCHARIDE IRON COMPLEX 150 MG PO CAPS: 150.0000 mg | ORAL_CAPSULE | Freq: Every day | ORAL | 3 refills | Status: DC

## 2019-07-25 NOTE — Assessment & Plan Note (Signed)
Patient presents for hospital follow-up of symptomatic anemia.  Patient was seen and a GI appointment and found to have a hemoglobin of 5.4. Given 2 units, Hgb 7.4 .  Discharge hemoglobin around 7.2. Patient reports no symptoms since being discharged.  History of fibroids and heavy periods requiring 5-6 extra absorbent pads per day. Periods last 6 days. Denies any previous admissions for blood transfusions or being treated for anemia. Anemia labs in the hospital consistent with Iron deficiency Anemia. Microcytic anemia and Ferritin of 2.  Transvaginal ultrasound showing large fibroids.  Patient seen once by Dr. Gwyneth Revels with [facility] and would like to follow-up with this doctor.  For personal religious reasons patient with like to have surgery.  Discussed referral to OB/GYN to discuss options and counseled patient on possible side effects of chronic anemia. - NU-Iron 150 mg daily - CBC - Unable to find Dr. Raphael Gibney under referrals, plan to call office tomorrow and insure he is still practicing. Will discuss other options if not when I call patient with lab results.

## 2019-07-25 NOTE — Progress Notes (Signed)
   CC: Anemia  HPI:[name] is a 48 y.o. with past medical history listed below.  Patient recently admitted to the hospital for symptomatic anemia.  Please see problem based charting for details of assessment and plan.  Past Medical History:  Diagnosis Date  . Constipation   . Fibroid    Review of Systems:  Review of Systems  Constitutional: Negative for chills and fever.  Neurological: Negative for dizziness and headaches.      Vitals:   07/25/19 1609  BP: 131/75  Pulse: 65  Temp: 98.8 F (37.1 C)  TempSrc: Oral  SpO2: 100%  Weight: 182 lb 4.8 oz (82.7 kg)  Height: 5\' 4"  (1.626 m)   Physical Exam: Physical Exam Constitutional:      Appearance: Normal appearance.  Eyes:     General: No scleral icterus.    Comments: Pale conjunctivae   Cardiovascular:     Rate and Rhythm: Normal rate and regular rhythm.     Heart sounds: No murmur. No friction rub. No gallop.   Pulmonary:     Breath sounds: Normal breath sounds. No wheezing, rhonchi or rales.  Abdominal:     General: Bowel sounds are normal.     Tenderness: There is no abdominal tenderness.  Skin:    General: Skin is warm and dry.  Neurological:     Mental Status: She is alert.      Assessment & Plan:   See Encounters Tab for problem based charting.  Patient seen with Dr. Daryll Drown

## 2019-07-25 NOTE — Patient Instructions (Addendum)
Thank you for trusting me with your care. To recap, today we discussed the following:   Anemia - I will follow up with blood work results tomorrow. - Start NU- Iron 150 mg daily - I will put in a referral for you to see Dr.AV Stringer.   Constipation - I expect your constipation could also be caused by your fibroids.  - Continue to take Trulance as discussed with GI specialist.   My best,  Tamsen Snider, MD

## 2019-07-26 LAB — CBC
Hematocrit: 27.4 % — ABNORMAL LOW (ref 34.0–46.6)
Hemoglobin: 7.6 g/dL — ABNORMAL LOW (ref 11.1–15.9)
MCH: 18.8 pg — ABNORMAL LOW (ref 26.6–33.0)
MCHC: 27.7 g/dL — ABNORMAL LOW (ref 31.5–35.7)
MCV: 68 fL — ABNORMAL LOW (ref 79–97)
Platelets: 549 10*3/uL — ABNORMAL HIGH (ref 150–450)
RBC: 4.04 x10E6/uL (ref 3.77–5.28)
RDW: 28.9 % — ABNORMAL HIGH (ref 11.7–15.4)
WBC: 7.6 10*3/uL (ref 3.4–10.8)

## 2019-07-26 NOTE — Addendum Note (Signed)
Addended by: Lyndal Pulley on: 07/26/2019 10:33 AM   Modules accepted: Orders

## 2019-07-28 NOTE — Progress Notes (Signed)
Internal Medicine Clinic Attending  I saw and evaluated the patient.  I personally confirmed the key portions of the history and exam documented by Dr. Steen and I reviewed pertinent patient test results.  The assessment, diagnosis, and plan were formulated together and I agree with the documentation in the resident's note.     

## 2019-07-28 NOTE — Addendum Note (Signed)
Addended by: Gilles Chiquito B on: 07/28/2019 11:24 AM   Modules accepted: Level of Service

## 2019-11-15 ENCOUNTER — Other Ambulatory Visit: Payer: Self-pay | Admitting: Internal Medicine

## 2020-10-12 HISTORY — PX: COLONOSCOPY: SHX174

## 2021-04-15 ENCOUNTER — Encounter: Payer: Self-pay | Admitting: *Deleted

## 2021-10-30 ENCOUNTER — Other Ambulatory Visit: Payer: Self-pay | Admitting: Obstetrics and Gynecology

## 2021-11-09 NOTE — Progress Notes (Signed)
Surgical Instructions    Your procedure is scheduled on Feb. 1.  Report to [facility] Main Entrance "A" at 7:00 A.M., then check in with the Admitting office.  Call this number if you have problems the morning of surgery:  [phone]   If you have any questions prior to your surgery date call [phone]: Open Monday-Friday 8am-4pm    Remember:  Do not eat after midnight the night before your surgery  You may drink clear liquids until 6:00 AM the morning of your surgery.   Clear liquids allowed are: Water, Non-Citrus Juices (without pulp), Carbonated Beverages, Clear Tea, Black Coffee ONLY (NO MILK, CREAM OR POWDERED CREAMER of any kind), and Gatorade    Take these medicines the morning of surgery with A SIP OF WATER : none   As of today, STOP taking any Aspirin (unless otherwise instructed by your surgeon) Aleve, Naproxen, Ibuprofen, Motrin, Advil, Goody's, BC's, all herbal medications, fish oil, and all vitamins.  After your COVID test   You are not required to quarantine however you are required to wear a well-fitting mask when you are out and around people not in your household.  If your mask becomes wet or soiled, replace with a new one.  Wash your hands often with soap and water for 20 seconds or clean your hands with an alcohol-based hand sanitizer that contains at least 60% alcohol.  Do not share personal items.  Notify your provider: if you are in close contact with someone who has COVID  or if you develop a fever of 100.4 or greater, sneezing, cough, sore throat, shortness of breath or body aches.           Do not wear jewelry or makeup Do not wear lotions, powders, perfumes/colognes, or deodorant. Do not shave 48 hours prior to surgery.  Men may shave face and neck. Do not bring valuables to the hospital. Do not wear nail polish, gel polish, artificial nails, or any other type of covering on natural nails (fingers and toes) If you have artificial nails or gel  coating that need to be removed by a nail salon, please have this removed prior to surgery. Artificial nails or gel coating may interfere with anesthesia's ability to adequately monitor your vital signs.             [facility] is not responsible for any belongings or valuables.  Do NOT Smoke (Tobacco/Vaping)  24 hours prior to your procedure  If you use a CPAP at night, you may bring your mask for your overnight stay.   Contacts, glasses, hearing aids, dentures or partials may not be worn into surgery, please bring cases for these belongings   For patients admitted to the hospital, discharge time will be determined by your treatment team.   Patients discharged the day of surgery will not be allowed to drive home, and someone needs to stay with them for 24 hours.  NO VISITORS WILL BE ALLOWED IN PRE-OP WHERE PATIENTS ARE PREPPED FOR SURGERY.  ONLY 1 SUPPORT PERSON MAY BE PRESENT IN THE WAITING ROOM WHILE YOU ARE IN SURGERY.  IF YOU ARE TO BE ADMITTED, ONCE YOU ARE IN YOUR ROOM YOU WILL BE ALLOWED TWO (2) VISITORS. 1 (ONE) VISITOR MAY STAY OVERNIGHT BUT MUST ARRIVE TO THE ROOM BY 8pm.  Minor children may have two parents present. Special consideration for safety and communication needs will be reviewed on a case by case basis.  Special instructions:    Oral Hygiene  is also important to reduce your risk of infection.  Remember - BRUSH YOUR TEETH THE MORNING OF SURGERY WITH YOUR REGULAR TOOTHPASTE   [facility]- Preparing For Surgery  Before surgery, you can play an important role. Because skin is not sterile, your skin needs to be as free of germs as possible. You can reduce the number of germs on your skin by washing with CHG (chlorahexidine gluconate) Soap before surgery.  CHG is an antiseptic cleaner which kills germs and bonds with the skin to continue killing germs even after washing.     Please do not use if you have an allergy to CHG or antibacterial soaps. If your skin becomes  reddened/irritated stop using the CHG.  Do not shave (including legs and underarms) for at least 48 hours prior to first CHG shower. It is OK to shave your face.  Please follow these instructions carefully.     Shower the NIGHT BEFORE SURGERY and the MORNING OF SURGERY with CHG Soap.   If you chose to wash your hair, wash your hair first as usual with your normal shampoo. After you shampoo, rinse your hair and body thoroughly to remove the shampoo.  Then [employer] and genitals (private parts) with your normal soap and rinse thoroughly to remove soap.  After that Use CHG Soap as you would any other liquid soap. You can apply CHG directly to the skin and wash gently with a scrungie or a clean washcloth.   Apply the CHG Soap to your body ONLY FROM THE NECK DOWN.  Do not use on open wounds or open sores. Avoid contact with your eyes, ears, mouth and genitals (private parts). Wash Face and genitals (private parts)  with your normal soap.   Wash thoroughly, paying special attention to the area where your surgery will be performed.  Thoroughly rinse your body with warm water from the neck down.  DO NOT shower/wash with your normal soap after using and rinsing off the CHG Soap.  Pat yourself dry with a CLEAN TOWEL.  Wear CLEAN PAJAMAS to bed the night before surgery  Place CLEAN SHEETS on your bed the night before your surgery  DO NOT SLEEP WITH PETS.   Day of Surgery:  Take a shower with CHG soap. Wear Clean/Comfortable clothing the morning of surgery Do not apply any deodorants/lotions.   Remember to brush your teeth WITH YOUR REGULAR TOOTHPASTE.   Please read over the following fact sheets that you were given.

## 2021-11-10 ENCOUNTER — Other Ambulatory Visit: Payer: Self-pay

## 2021-11-10 ENCOUNTER — Encounter (HOSPITAL_COMMUNITY): Payer: Self-pay

## 2021-11-10 ENCOUNTER — Encounter (HOSPITAL_COMMUNITY)
Admission: RE | Admit: 2021-11-10 | Discharge: 2021-11-10 | Disposition: A | Discharge status: Home / Self Care | Payer: BC Managed Care – PPO | Source: Ambulatory Visit | Attending: Obstetrics and Gynecology | Admitting: Obstetrics and Gynecology

## 2021-11-10 VITALS — BP 125/77 | HR 68 | Temp 98.4°F | Resp 17 | Ht 64.0 in | Wt 183.4 lb

## 2021-11-10 DIAGNOSIS — N920 Excessive and frequent menstruation with regular cycle: Secondary | ICD-10-CM | POA: Insufficient documentation

## 2021-11-10 DIAGNOSIS — Z01812 Encounter for preprocedural laboratory examination: Secondary | ICD-10-CM | POA: Insufficient documentation

## 2021-11-10 DIAGNOSIS — Z01818 Encounter for other preprocedural examination: Secondary | ICD-10-CM

## 2021-11-10 DIAGNOSIS — Z20822 Contact with and (suspected) exposure to covid-19: Secondary | ICD-10-CM | POA: Insufficient documentation

## 2021-11-10 LAB — CBC
HCT: 35.3 % — ABNORMAL LOW (ref 36.0–46.0)
Hemoglobin: 11.4 g/dL — ABNORMAL LOW (ref 12.0–15.0)
MCH: 27.6 pg (ref 26.0–34.0)
MCHC: 32.3 g/dL (ref 30.0–36.0)
MCV: 85.5 fL (ref 80.0–100.0)
Platelets: 302 10*3/uL (ref 150–400)
RBC: 4.13 MIL/uL (ref 3.87–5.11)
RDW: 15.5 % (ref 11.5–15.5)
WBC: 5.3 10*3/uL (ref 4.0–10.5)
nRBC: 0 % (ref 0.0–0.2)

## 2021-11-10 NOTE — Progress Notes (Signed)
PCP - [facility] in [place], [place]  Chest x-ray - Not indicated EKG - 09/03/21 Stress Test - Denies ECHO - 02/01/20 Cardiac Cath - Denies  Sleep Study - Denies  DM - Denies  Blood Thinner Instructions:Denies Aspirin Instructions:Denies  COVID TEST- 11/10/21  Anesthesia review: No  Patient denies shortness of breath, fever, cough and chest pain at PAT appointment   All instructions explained to the patient, with a verbal understanding of the material. Patient agrees to go over the instructions while at home for a better understanding. Patient also instructed to wear a mask while in public after being tested for COVID-19. The opportunity to ask questions was provided.

## 2021-11-11 LAB — SARS CORONAVIRUS 2 (TAT 6-24 HRS): SARS Coronavirus 2: NEGATIVE

## 2021-11-12 ENCOUNTER — Encounter (HOSPITAL_COMMUNITY): Payer: Self-pay | Admitting: Obstetrics and Gynecology

## 2021-11-12 ENCOUNTER — Other Ambulatory Visit: Payer: Self-pay

## 2021-11-12 ENCOUNTER — Inpatient Hospital Stay (HOSPITAL_COMMUNITY)
Admission: RE | Admit: 2021-11-12 | Discharge: 2021-11-14 | DRG: 742 | Disposition: A | Discharge status: Home / Self Care | Payer: BC Managed Care – PPO | Attending: Obstetrics and Gynecology | Admitting: Obstetrics and Gynecology

## 2021-11-12 ENCOUNTER — Inpatient Hospital Stay (HOSPITAL_COMMUNITY): Payer: BC Managed Care – PPO | Admitting: Anesthesiology

## 2021-11-12 ENCOUNTER — Encounter (HOSPITAL_COMMUNITY)
Admission: RE | Disposition: A | Discharge status: Home / Self Care | Payer: Self-pay | Source: Home / Self Care | Attending: Obstetrics and Gynecology

## 2021-11-12 DIAGNOSIS — D649 Anemia, unspecified: Secondary | ICD-10-CM | POA: Diagnosis not present

## 2021-11-12 DIAGNOSIS — Z20822 Contact with and (suspected) exposure to covid-19: Secondary | ICD-10-CM | POA: Diagnosis present

## 2021-11-12 DIAGNOSIS — N9971 Accidental puncture and laceration of a genitourinary system organ or structure during a genitourinary system procedure: Secondary | ICD-10-CM | POA: Diagnosis not present

## 2021-11-12 DIAGNOSIS — D259 Leiomyoma of uterus, unspecified: Principal | ICD-10-CM | POA: Diagnosis present

## 2021-11-12 DIAGNOSIS — N946 Dysmenorrhea, unspecified: Secondary | ICD-10-CM | POA: Diagnosis present

## 2021-11-12 DIAGNOSIS — N920 Excessive and frequent menstruation with regular cycle: Secondary | ICD-10-CM | POA: Diagnosis present

## 2021-11-12 DIAGNOSIS — Z90710 Acquired absence of both cervix and uterus: Secondary | ICD-10-CM | POA: Diagnosis present

## 2021-11-12 HISTORY — PX: ABDOMINAL HYSTERECTOMY: SHX81

## 2021-11-12 HISTORY — PX: BLADDER REPAIR: SHX6721

## 2021-11-12 LAB — BASIC METABOLIC PANEL
Anion gap: 9 (ref 5–15)
BUN: 15 mg/dL (ref 6–20)
CO2: 21 mmol/L — ABNORMAL LOW (ref 22–32)
Calcium: 9.5 mg/dL (ref 8.9–10.3)
Chloride: 108 mmol/L (ref 98–111)
Creatinine, Ser: 0.67 mg/dL (ref 0.44–1.00)
GFR, Estimated: >60 mL/min (ref >60)
Glucose, Bld: 83 mg/dL (ref 70–99)
Potassium: 3.9 mmol/L (ref 3.5–5.1)
Sodium: 138 mmol/L (ref 135–145)

## 2021-11-12 LAB — PREPARE RBC (CROSSMATCH)

## 2021-11-12 LAB — POCT I-STAT, CHEM 8
BUN: 11 mg/dL (ref 6–20)
BUN: 11 mg/dL (ref 6–20)
Calcium, Ion: 1.19 mmol/L (ref 1.15–1.40)
Calcium, Ion: 1.2 mmol/L (ref 1.15–1.40)
Chloride: 107 mmol/L (ref 98–111)
Chloride: 106 mmol/L (ref 98–111)
Creatinine, Ser: 0.5 mg/dL (ref 0.44–1.00)
Creatinine, Ser: 0.4 mg/dL — ABNORMAL LOW (ref 0.44–1.00)
Glucose, Bld: 123 mg/dL — ABNORMAL HIGH (ref 70–99)
Glucose, Bld: 137 mg/dL — ABNORMAL HIGH (ref 70–99)
HCT: 23 % — ABNORMAL LOW (ref 36.0–46.0)
HCT: 24 % — ABNORMAL LOW (ref 36.0–46.0)
Hemoglobin: 7.8 g/dL — ABNORMAL LOW (ref 12.0–15.0)
Hemoglobin: 8.2 g/dL — ABNORMAL LOW (ref 12.0–15.0)
Potassium: 3.7 mmol/L (ref 3.5–5.1)
Potassium: 3.8 mmol/L (ref 3.5–5.1)
Sodium: 138 mmol/L (ref 135–145)
Sodium: 139 mmol/L (ref 135–145)
TCO2: 22 mmol/L (ref 22–32)
TCO2: 21 mmol/L — ABNORMAL LOW (ref 22–32)

## 2021-11-12 LAB — POCT PREGNANCY, URINE: Preg Test, Ur: NEGATIVE

## 2021-11-12 SURGERY — HYSTERECTOMY, ABDOMINAL
Anesthesia: General | Site: Uterus

## 2021-11-12 MED ORDER — SODIUM CHLORIDE 0.9% FLUSH: 9.0000 mL | INTRAVENOUS | Status: DC | PRN

## 2021-11-12 MED ORDER — ONDANSETRON HCL 4 MG/2ML IJ SOLN
4.0000 mg | Freq: Four times a day (QID) | INTRAMUSCULAR | Status: DC | PRN
  Administered 2021-11-12: 4 mg via INTRAVENOUS
  Filled 2021-11-12: qty 2

## 2021-11-12 MED ORDER — SUCCINYLCHOLINE CHLORIDE 200 MG/10ML IV SOSY
PREFILLED_SYRINGE | INTRAVENOUS | Status: AC
  Filled 2021-11-12: qty 10

## 2021-11-12 MED ORDER — PROPOFOL 10 MG/ML IV BOLUS
INTRAVENOUS | Status: DC | PRN
Start: 2021-11-12 — End: 2021-11-12
  Administered 2021-11-12: 160 mg via INTRAVENOUS

## 2021-11-12 MED ORDER — PHENYLEPHRINE 40 MCG/ML (10ML) SYRINGE FOR IV PUSH (FOR BLOOD PRESSURE SUPPORT)
PREFILLED_SYRINGE | INTRAVENOUS | Status: AC
  Filled 2021-11-12: qty 10

## 2021-11-12 MED ORDER — MIDAZOLAM HCL 2 MG/2ML IJ SOLN
INTRAMUSCULAR | Status: AC
  Filled 2021-11-12: qty 2

## 2021-11-12 MED ORDER — HYDROMORPHONE HCL 1 MG/ML IJ SOLN
INTRAMUSCULAR | Status: AC
  Filled 2021-11-12: qty 1

## 2021-11-12 MED ORDER — ROCURONIUM BROMIDE 10 MG/ML (PF) SYRINGE
PREFILLED_SYRINGE | INTRAVENOUS | Status: AC
  Filled 2021-11-12: qty 10

## 2021-11-12 MED ORDER — ONDANSETRON HCL 4 MG/2ML IJ SOLN
INTRAMUSCULAR | Status: AC
  Filled 2021-11-12: qty 2

## 2021-11-12 MED ORDER — KETAMINE HCL 50 MG/5ML IJ SOSY
PREFILLED_SYRINGE | INTRAMUSCULAR | Status: AC
  Filled 2021-11-12: qty 5

## 2021-11-12 MED ORDER — PHENYLEPHRINE 40 MCG/ML (10ML) SYRINGE FOR IV PUSH (FOR BLOOD PRESSURE SUPPORT)
PREFILLED_SYRINGE | INTRAVENOUS | Status: DC | PRN
  Administered 2021-11-12 (×4): 80 ug via INTRAVENOUS

## 2021-11-12 MED ORDER — FENTANYL CITRATE (PF) 250 MCG/5ML IJ SOLN
INTRAMUSCULAR | Status: DC | PRN
  Administered 2021-11-12 (×2): 50 ug via INTRAVENOUS
  Administered 2021-11-12: 150 ug via INTRAVENOUS

## 2021-11-12 MED ORDER — KETAMINE HCL-SODIUM CHLORIDE 100-0.9 MG/10ML-% IV SOSY
PREFILLED_SYRINGE | INTRAVENOUS | Status: DC | PRN
  Administered 2021-11-12 (×2): 20 mg via INTRAVENOUS

## 2021-11-12 MED ORDER — ACETAMINOPHEN 500 MG PO TABS
1000.0000 mg | ORAL_TABLET | Freq: Once | ORAL | Status: AC
  Administered 2021-11-12: 1000 mg via ORAL
  Filled 2021-11-12: qty 2

## 2021-11-12 MED ORDER — ONDANSETRON HCL 4 MG/2ML IJ SOLN: 4.0000 mg | Freq: Four times a day (QID) | INTRAMUSCULAR | Status: DC | PRN

## 2021-11-12 MED ORDER — ONDANSETRON HCL 4 MG/2ML IJ SOLN
INTRAMUSCULAR | Status: DC | PRN
  Administered 2021-11-12: 4 mg via INTRAVENOUS

## 2021-11-12 MED ORDER — METHYLENE BLUE 0.5 % INJ SOLN
INTRAVENOUS | Status: AC
  Filled 2021-11-12: qty 10

## 2021-11-12 MED ORDER — NALOXONE HCL 0.4 MG/ML IJ SOLN: 0.4000 mg | INTRAMUSCULAR | Status: DC | PRN

## 2021-11-12 MED ORDER — CEFAZOLIN SODIUM-DEXTROSE 2-4 GM/100ML-% IV SOLN
2.0000 g | Freq: Once | INTRAVENOUS | Status: AC
  Administered 2021-11-12: 2 g via INTRAVENOUS
  Filled 2021-11-12: qty 100

## 2021-11-12 MED ORDER — DIPHENHYDRAMINE HCL 50 MG/ML IJ SOLN: 12.5000 mg | Freq: Four times a day (QID) | INTRAMUSCULAR | Status: DC | PRN

## 2021-11-12 MED ORDER — LACTATED RINGERS IV SOLN: INTRAVENOUS | Status: DC

## 2021-11-12 MED ORDER — 0.9 % SODIUM CHLORIDE (POUR BTL) OPTIME
TOPICAL | Status: DC | PRN
Start: 2021-11-12 — End: 2021-11-12
  Administered 2021-11-12: 1000 mL

## 2021-11-12 MED ORDER — METHYLENE BLUE 0.5 % INJ SOLN
INTRAVENOUS | Status: DC | PRN
  Administered 2021-11-12: 5 mL

## 2021-11-12 MED ORDER — ALBUMIN HUMAN 5 % IV SOLN: INTRAVENOUS | Status: DC | PRN

## 2021-11-12 MED ORDER — ONDANSETRON HCL 4 MG PO TABS: 4.0000 mg | ORAL_TABLET | Freq: Four times a day (QID) | ORAL | Status: DC | PRN

## 2021-11-12 MED ORDER — SODIUM CHLORIDE 0.9 % IV SOLN: INTRAVENOUS | Status: DC | PRN

## 2021-11-12 MED ORDER — LACTATED RINGERS IV SOLN: INTRAVENOUS | Status: DC | PRN

## 2021-11-12 MED ORDER — SUGAMMADEX SODIUM 200 MG/2ML IV SOLN
INTRAVENOUS | Status: DC | PRN
Start: 2021-11-12 — End: 2021-11-12
  Administered 2021-11-12: 200 mg via INTRAVENOUS

## 2021-11-12 MED ORDER — PHENYLEPHRINE HCL-NACL 20-0.9 MG/250ML-% IV SOLN
INTRAVENOUS | Status: DC | PRN
  Administered 2021-11-12: 25 ug/min via INTRAVENOUS

## 2021-11-12 MED ORDER — PROPOFOL 10 MG/ML IV BOLUS
INTRAVENOUS | Status: AC
  Filled 2021-11-12: qty 20

## 2021-11-12 MED ORDER — CHLORHEXIDINE GLUCONATE 0.12 % MT SOLN
15.0000 mL | Freq: Once | OROMUCOSAL | Status: AC
  Administered 2021-11-12: 15 mL via OROMUCOSAL
  Filled 2021-11-12: qty 15

## 2021-11-12 MED ORDER — POLYETHYLENE GLYCOL 3350 17 GM/SCOOP PO POWD
1.0000 | Freq: Once | ORAL | Status: DC
  Filled 2021-11-12: qty 255

## 2021-11-12 MED ORDER — KETOROLAC TROMETHAMINE 30 MG/ML IJ SOLN
INTRAMUSCULAR | Status: AC
  Filled 2021-11-12: qty 1

## 2021-11-12 MED ORDER — KETOROLAC TROMETHAMINE 30 MG/ML IJ SOLN
INTRAMUSCULAR | Status: DC | PRN
  Administered 2021-11-12: 30 mg via INTRAVENOUS

## 2021-11-12 MED ORDER — CHLORHEXIDINE GLUCONATE 0.12 % MT SOLN
15.0000 mL | Freq: Once | OROMUCOSAL | Status: AC
  Administered 2021-11-12: 15 mL via OROMUCOSAL

## 2021-11-12 MED ORDER — DEXTROSE IN LACTATED RINGERS 5 % IV SOLN
INTRAVENOUS | Status: DC
  Administered 2021-11-12 – 2021-11-13 (×2): 125 mL/h via INTRAVENOUS

## 2021-11-12 MED ORDER — DEXAMETHASONE SODIUM PHOSPHATE 10 MG/ML IJ SOLN
INTRAMUSCULAR | Status: AC
  Filled 2021-11-12: qty 1

## 2021-11-12 MED ORDER — GABAPENTIN 300 MG PO CAPS
300.0000 mg | ORAL_CAPSULE | Freq: Once | ORAL | Status: AC
  Administered 2021-11-12: 300 mg via ORAL
  Filled 2021-11-12: qty 1

## 2021-11-12 MED ORDER — MIDAZOLAM HCL 5 MG/5ML IJ SOLN
INTRAMUSCULAR | Status: DC | PRN
  Administered 2021-11-12: 2 mg via INTRAVENOUS

## 2021-11-12 MED ORDER — DIPHENHYDRAMINE HCL 12.5 MG/5ML PO ELIX: 12.5000 mg | ORAL_SOLUTION | Freq: Four times a day (QID) | ORAL | Status: DC | PRN

## 2021-11-12 MED ORDER — METOCLOPRAMIDE HCL 5 MG/ML IJ SOLN
10.0000 mg | Freq: Four times a day (QID) | INTRAMUSCULAR | Status: DC
  Administered 2021-11-12 – 2021-11-13 (×3): 10 mg via INTRAVENOUS
  Filled 2021-11-12 (×3): qty 2

## 2021-11-12 MED ORDER — LIDOCAINE 2% (20 MG/ML) 5 ML SYRINGE
INTRAMUSCULAR | Status: AC
  Filled 2021-11-12: qty 5

## 2021-11-12 MED ORDER — HYDROMORPHONE HCL 1 MG/ML IJ SOLN
0.2500 mg | INTRAMUSCULAR | Status: DC | PRN
  Administered 2021-11-12 (×3): 0.25 mg via INTRAVENOUS

## 2021-11-12 MED ORDER — AMISULPRIDE (ANTIEMETIC) 5 MG/2ML IV SOLN
INTRAVENOUS | Status: AC
  Filled 2021-11-12: qty 4

## 2021-11-12 MED ORDER — AMISULPRIDE (ANTIEMETIC) 5 MG/2ML IV SOLN
10.0000 mg | Freq: Once | INTRAVENOUS | Status: AC | PRN
  Administered 2021-11-12: 10 mg via INTRAVENOUS

## 2021-11-12 MED ORDER — FENTANYL CITRATE (PF) 250 MCG/5ML IJ SOLN
INTRAMUSCULAR | Status: AC
  Filled 2021-11-12: qty 5

## 2021-11-12 MED ORDER — ORAL CARE MOUTH RINSE: 15.0000 mL | Freq: Once | OROMUCOSAL | Status: AC

## 2021-11-12 MED ORDER — HEMOSTATIC AGENTS (NO CHARGE) OPTIME
TOPICAL | Status: DC | PRN
  Administered 2021-11-12: 1

## 2021-11-12 MED ORDER — SCOPOLAMINE 1 MG/3DAYS TD PT72
1.0000 | MEDICATED_PATCH | TRANSDERMAL | Status: DC
  Administered 2021-11-12: 1.5 mg via TRANSDERMAL
  Filled 2021-11-12: qty 1

## 2021-11-12 MED ORDER — DEXAMETHASONE SODIUM PHOSPHATE 10 MG/ML IJ SOLN
INTRAMUSCULAR | Status: DC | PRN
Start: 2021-11-12 — End: 2021-11-12
  Administered 2021-11-12: 8 mg via INTRAVENOUS

## 2021-11-12 MED ORDER — LIDOCAINE 2% (20 MG/ML) 5 ML SYRINGE
INTRAMUSCULAR | Status: DC | PRN
  Administered 2021-11-12: 60 mg via INTRAVENOUS

## 2021-11-12 MED ORDER — MENTHOL 3 MG MT LOZG: 1.0000 | LOZENGE | OROMUCOSAL | Status: DC | PRN

## 2021-11-12 MED ORDER — HYDROMORPHONE 1 MG/ML IV SOLN
INTRAVENOUS | Status: DC
  Administered 2021-11-12: 0 mL via INTRAVENOUS
  Administered 2021-11-12: 0.6 mg via INTRAVENOUS
  Administered 2021-11-13: 0.2 mg via INTRAVENOUS
  Filled 2021-11-12: qty 30

## 2021-11-12 MED ORDER — ROCURONIUM BROMIDE 10 MG/ML (PF) SYRINGE
PREFILLED_SYRINGE | INTRAVENOUS | Status: DC | PRN
Start: 2021-11-12 — End: 2021-11-12
  Administered 2021-11-12 (×2): 20 mg via INTRAVENOUS
  Administered 2021-11-12: 50 mg via INTRAVENOUS
  Administered 2021-11-12: 10 mg via INTRAVENOUS

## 2021-11-12 SURGICAL SUPPLY — 56 items
BAG COUNTER SPONGE SURGICOUNT (BAG) ×4 IMPLANT
BARRIER ADHS 3X4 INTERCEED (GAUZE/BANDAGES/DRESSINGS) IMPLANT
CANISTER SUCT 3000ML PPV (MISCELLANEOUS) ×4 IMPLANT
CLIP TI MEDIUM 6 (CLIP) ×2 IMPLANT
DECANTER SPIKE VIAL GLASS SM (MISCELLANEOUS) ×4 IMPLANT
DRAIN JACKSON PRT FLT 7MM (DRAIN) IMPLANT
DRAIN JP 10F RND SILICONE (MISCELLANEOUS) ×2 IMPLANT
DRAPE CESAREAN BIRTH W POUCH (DRAPES) ×4 IMPLANT
DRAPE WARM FLUID 44X44 (DRAPES) ×4 IMPLANT
DRSG OPSITE POSTOP 4X10 (GAUZE/BANDAGES/DRESSINGS) ×4 IMPLANT
ELECT NDL TIP 2.8 STRL (NEEDLE) IMPLANT
ELECT NEEDLE TIP 2.8 STRL (NEEDLE) IMPLANT
EVACUATOR SILICONE 100CC (DRAIN) ×2 IMPLANT
GAUZE 4X4 16PLY ~~LOC~~+RFID DBL (SPONGE) ×2 IMPLANT
GLOVE SURG ENC MOIS LTX SZ6.5 (GLOVE) ×4 IMPLANT
GLOVE SURG UNDER POLY LF SZ7 (GLOVE) ×8 IMPLANT
GOWN STRL REUS W/ TWL LRG LVL3 (GOWN DISPOSABLE) ×6 IMPLANT
GOWN STRL REUS W/TWL LRG LVL3 (GOWN DISPOSABLE) ×2
HEMOSTAT ARISTA ABSORB 3G PWDR (HEMOSTASIS) ×2 IMPLANT
HEMOSTAT SURGICEL 2X14 (HEMOSTASIS) IMPLANT
KIT TURNOVER KIT B (KITS) ×4 IMPLANT
MANIPULATOR UTERINE 4.5 ZUMI (MISCELLANEOUS) IMPLANT
NEEDLE HYPO 22GX1.5 SAFETY (NEEDLE) ×8 IMPLANT
NS IRRIG 1000ML POUR BTL (IV SOLUTION) ×4 IMPLANT
PACK ABDOMINAL GYN (CUSTOM PROCEDURE TRAY) ×4 IMPLANT
PAD ARMBOARD 7.5X6 YLW CONV (MISCELLANEOUS) ×4 IMPLANT
PAD OB MATERNITY 4.3X12.25 (PERSONAL CARE ITEMS) ×4 IMPLANT
PENCIL SMOKE EVACUATOR (MISCELLANEOUS) ×4 IMPLANT
SET CYSTO W/LG BORE CLAMP LF (SET/KITS/TRAYS/PACK) ×2 IMPLANT
SHEET LAVH (DRAPES) IMPLANT
SPONGE SURGIFOAM ABS GEL 12-7 (HEMOSTASIS) IMPLANT
SPONGE T-LAP 18X18 ~~LOC~~+RFID (SPONGE) ×10 IMPLANT
STAPLER VISISTAT 35W (STAPLE) ×4 IMPLANT
STRIP CLOSURE SKIN 1/2X4 (GAUZE/BANDAGES/DRESSINGS) ×2 IMPLANT
SUT CHROMIC 0 CT 1 (SUTURE) ×6 IMPLANT
SUT CHROMIC 3 0 SH 27 (SUTURE) ×2 IMPLANT
SUT MNCRL AB 3-0 PS2 27 (SUTURE) ×2 IMPLANT
SUT PDS AB 0 CT 36 (SUTURE) ×4 IMPLANT
SUT PLAIN 2 0 XLH (SUTURE) IMPLANT
SUT SILK 2 0 SH (SUTURE) ×2 IMPLANT
SUT VIC AB 0 CT1 18XCR BRD8 (SUTURE) ×12 IMPLANT
SUT VIC AB 0 CT1 36 (SUTURE) ×8 IMPLANT
SUT VIC AB 0 CT1 8-18 (SUTURE) ×6
SUT VIC AB 2-0 SH 27 (SUTURE) ×2
SUT VIC AB 2-0 SH 27XBRD (SUTURE) ×2 IMPLANT
SUT VIC AB 3-0 SH 27 (SUTURE) ×1
SUT VIC AB 3-0 SH 27X BRD (SUTURE) ×1 IMPLANT
SUT VICRYL 0 27 CT2 27 ABS (SUTURE) IMPLANT
SUT VICRYL 0 TIES 12 18 (SUTURE) ×2 IMPLANT
SUT VICRYL 0 UR6 27IN ABS (SUTURE) IMPLANT
SUT VICRYL 2 0 18  UND BR (SUTURE)
SUT VICRYL 2 0 18 UND BR (SUTURE) IMPLANT
SYR 50ML LL SCALE MARK (SYRINGE) IMPLANT
SYR CONTROL 10ML LL (SYRINGE) ×8 IMPLANT
TOWEL GREEN STERILE FF (TOWEL DISPOSABLE) ×8 IMPLANT
TRAY FOLEY W/BAG SLVR 14FR (SET/KITS/TRAYS/PACK) ×4 IMPLANT

## 2021-11-12 NOTE — Progress Notes (Signed)
Day of Surgery Procedure(s): HYSTERECTOMY ABDOMINAL B SALPINGECTOMY  BLADDER REPAIR  Subjective: Patient reports incisional pain and tolerating PO.    Objective: BP 130/61 (BP Location: Left Arm)    Pulse 63    Temp 97.8 F (36.6 C) (Oral)    Resp 18    Ht 5\' 4"  (1.626 m)    Wt 81.6 kg    LMP 10/27/2021 (Exact Date)    SpO2 100%    BMI 30.90 kg/m   General: alert GI: soft, non-tender; bowel sounds normal; no masses,  no organomegaly and incision: dry, intact, and DRAIN  WITH SEROUS  DRAINAGE Extremities: Homans sign is negative, no sign of DVT Vaginal Bleeding: none  Assessment: s/p Procedure(s): HYSTERECTOMY ABDOMINAL B salpingectomy BLADDER REPAIR: stable  Plan: Advance diet Continue foley due to urinary output monitoring and let bladder laceration heal Continue IVF and PCA Check creatinine on drain fluid and bmet Plan to keep foley in for 10 days   LOS: 0 days    Jaycob Mcclenton A Tasheena Wambolt 11/12/2021, 6:10 PM

## 2021-11-12 NOTE — Op Note (Signed)
Preoperative diagnosis: Bladder injury  Postoperative diagnosis: Bladder injury  Procedure: Repair of bladder injury  Surgeon: Pryor Curia MD  Anesthesia: General  Indication: I received an intraoperative consult from Dr. Charlesetta Garibaldi following the patient's hysterectomy.  She was noted to have what appeared to be a bladder injury at the dome of the bladder.  This was repaired primarily by Dr. Charlesetta Garibaldi but she did request urologic intraoperative consultation for further evaluation.  Description of procedure: The patient had a midline laparotomy.  On examination the bladder, there were noted to be a few Vicryl sutures at the dome of the bladder indicating the site of the bladder repair by Dr. Charlesetta Garibaldi.  We then filled the bladder via the urethral catheter with methylene blue.  There was noted to be blue extravasation across the dome of the bladder.  Using 2-0 Vicryl sutures, I performed a running imbricating repair of the dome of the bladder.  The bladder was then filled again and there was a another site that was noted that appeared to demonstrate some extravasation.  This was also repaired with a 2-0 Vicryl suture.  At this point, the bladder repair appeared adequate.  There did not appear to be other areas of suspicion for injury according to the GYN surgical team.  As such, the catheter was placed to straight drainage.  I did request that a pelvic drain be placed at the end of the procedure.  The case was turned back over to Dr. Charlesetta Garibaldi.  She will require 10 to 14 days of urethral catheter drainage followed by a cystogram and a possible voiding trial at that time.

## 2021-11-12 NOTE — Plan of Care (Signed)
Problem: Education: Goal: Knowledge of General Education information will improve Description: Including pain rating scale, medication(s)/side effects and non-pharmacologic comfort measures Outcome: Completed/Met   Problem: Coping: Goal: Level of anxiety will decrease Outcome: Completed/Met   Problem: Education: Goal: Knowledge of the prescribed therapeutic regimen will improve Outcome: Completed/Met

## 2021-11-12 NOTE — Anesthesia Preprocedure Evaluation (Signed)
Anesthesia Evaluation  Patient identified by MRN, date of birth, ID band Patient awake    Reviewed: Allergy & Precautions, NPO status , Patient's Chart, lab work & pertinent test results  Airway Mallampati: II  TM Distance: >3 FB Neck ROM: Full    Dental  (+) Dental Advisory Given   Pulmonary neg pulmonary ROS,    breath sounds clear to auscultation       Cardiovascular negative cardio ROS   Rhythm:Regular Rate:Normal     Neuro/Psych negative neurological ROS     GI/Hepatic negative GI ROS, Neg liver ROS,   Endo/Other  negative endocrine ROS  Renal/GU negative Renal ROS     Musculoskeletal   Abdominal   Peds  Hematology  (+) anemia ,   Anesthesia Other Findings   Reproductive/Obstetrics                             Anesthesia Physical Anesthesia Plan  ASA: 2  Anesthesia Plan: General   Post-op Pain Management: Tylenol PO (pre-op), Toradol IV (intra-op), Ketamine IV and Gabapentin PO (pre-op)   Induction: Intravenous  PONV Risk Score and Plan: 4 or greater and Dexamethasone, Ondansetron, Treatment may vary due to age or medical condition, Midazolam and Scopolamine patch - Pre-op  Airway Management Planned: Oral ETT  Additional Equipment: None  Intra-op Plan:   Post-operative Plan: Extubation in OR  Informed Consent: I have reviewed the patients History and Physical, chart, labs and discussed the procedure including the risks, benefits and alternatives for the proposed anesthesia with the patient or authorized representative who has indicated his/her understanding and acceptance.     Dental advisory given  Plan Discussed with:   Anesthesia Plan Comments: (2 IV's. GETA.)        Anesthesia Quick Evaluation

## 2021-11-12 NOTE — Transfer of Care (Signed)
Immediate Anesthesia Transfer of Care Note  Patient: Meagan Kerr  Procedure(s) Performed: HYSTERECTOMY ABDOMINAL (Uterus) BLADDER REPAIR (Bladder)  Patient Location: PACU  Anesthesia Type:General  Level of Consciousness: awake and patient cooperative  Airway & Oxygen Therapy: Patient Spontanous Breathing and Patient connected to face mask oxygen  Post-op Assessment: Report given to RN and Post -op Vital signs reviewed and stable  Post vital signs: Reviewed and stable  Last Vitals:  Vitals Value Taken Time  BP 108/56 11/12/21 1318  Temp    Pulse 72 11/12/21 1322  Resp 19 11/12/21 1322  SpO2 98 % 11/12/21 1322  Vitals shown include unvalidated device data.  Last Pain:  Vitals:   11/12/21 0743  TempSrc:   PainSc: 0-No pain         Complications: No notable events documented.

## 2021-11-12 NOTE — Op Note (Signed)
Indications: symptomatic Fibroids Menorrhagia dysmenorrhea  Pre-operative Diagnosis: see above  Post-operative Diagnosis: same  Operation:   Total abdominal hysterectomy.  Bilateral salpinectomy.  LOA and repair of bladder laceration  Surgeon: JYNWGNF,AOZHY A   Assistants: Waymon Amato  MD  Anesthesia: General endotracheal anesthesia  ASA Class: per anesthesia  IVF 1900 ccc crystalloid  UO 100 cc bloody urine  EBL 1200 cc   Procedure Details  The patient was seen in the Holding Room. The risks, benefits, complications, treatment options, and expected outcomes were discussed with the patient.  The patient concurred with the proposed plan, giving informed consent.   The patient was taken to Operating Room # 10, identified and the procedure verified as a T Total abdominal hysterectomy and salpingectomy.   A Time Out was held and the above information confirmed.  After induction of anesthesia, the patient was draped and prepped in the usual sterile manner. Pt was placed in supine position after anesthesia and draped and prepped in the usual sterile manner. Foley catheter was placed. Attention was then turned to the vagina.   . A vertical  incision was made with the knife on the skin 2 cm above the symphysis pubis along her previous incision.    Fascial incision was made and extended superiorly and inferioirly and superiorly.     The rectus muscles were separated. The peritoneum was identified and entered. Peritoneal incision was extended longitudinally. I tried to deliver the uterus but could not.  I extended the fascial and skin incision.  A tenaculum was placed at the fundus.  I still could not deliver the uterus.  A cork screw was placed into the uterus and it was delivered.  The tenaculum pulled through the uterus and the bleeding was slowed with figure of 8 suture.      The round ligaments were identified, cut, and ligated with 0-Vicryl. The anterior peritoneal reflection was incised  and the bladder was dissected off the lower uterine segment.  There were several bladder uterine adhesions.  These were lysed with mets and sponge stick was used to pull the bladder down on the cervix.  . The right utero-ovarian ligament and proximal fallopian tube were grasped, cut, and suture ligated with 0-Vicryl. The left utero-ovarian ligament and proximal fallopian tube were grasped, cut and suture ligated with 0-Vicryl.  The left ovary was adherant to the uterus and the adhesions were sharply lysed to free up the ovaries.  Hemostasis was observed. The fallopian tubes were clamped with kelly clamps and removed using metzenbaum scissors.    The uterine vessels were skeletonized, then clamped, cut and suture ligated with 0-Vicryl suture. The fundus was removed using the knife to obtain better visualization.  Belfour was placed in the abdomen.  While dissecting the bladder away fro the uterus, I noticed a opening in the bladder.  It was at the dome of the bladder.  The dome of the bladder was repaired with 2-0 and vicryl and and imbricating layer  using 3-0 vicryl.  Serial pedicles of the cardinal and utero-sacral ligaments were clamped, cut, and suture ligated with 0-Vicryl. Entrance was made into the vagina and the uterus removed. Vaginal cuff angle sutures were placed incorporating the utero-sacral ligaments for support. The vaginal cuff was then closed with a running stitch of 0- vicryl.   The urologist,  Dr Alinda Money was asked to come in to evaluate the bladder.  He agreed the opening was at the dome.  The methylene blue was seen to  exit out of the bladder.  Dr Alinda Money then closed the defect.  The details are in his op note.  The left ovary had some oozing that was made hemostatic around the mesosalpinx.  This was made hemostatic with suture and cautery  Lavage was carried out until clear. Arista was placed along the cuff.   A JP drain was placed along the bladder.    Retractor and all packing was removed  from the abdomen. The peritoneum was closed with 0 chromic.   The fascia was approximated with running sutures of 0-PDS.   Lavage was again carried out.  Hemostasis was observed. The subcutaneous tissue was reapproximated with o plain in interrupted suture.   The skin incision was  approximated 4-0 monocryl with subcuticular stitches.    Instrument, sponge, and needle counts were correct prior to abdominal closure and at the conclusion of the case.   Findings: 25 week size fibroid uterus.  Irregular and bulky.  Normal appearing adnexa B.  Bladder and uterine adhesions  Estimated Blood Loss:  200 mL         Drains: none         Total IV Fluids: 2015ml         Specimens: uterus , cervix and bilateral tubes         Implants: none         Complications:  None; patient tolerated the procedure well.         Disposition: PACU - hemodynamically stable.         Condition: stable  Attending Attestation: I was present and scrubbed for the entire procedure.

## 2021-11-12 NOTE — Anesthesia Postprocedure Evaluation (Signed)
Anesthesia Post Note  Patient: Meagan Kerr  Procedure(s) Performed: HYSTERECTOMY ABDOMINAL (Uterus) BLADDER REPAIR (Bladder)     Patient location during evaluation: PACU Anesthesia Type: General Level of consciousness: awake and alert Pain management: pain level controlled Vital Signs Assessment: post-procedure vital signs reviewed and stable Respiratory status: spontaneous breathing, nonlabored ventilation, respiratory function stable and patient connected to nasal cannula oxygen Cardiovascular status: blood pressure returned to baseline and stable Postop Assessment: no apparent nausea or vomiting Anesthetic complications: no   No notable events documented.  Last Vitals:  Vitals:   11/12/21 1457 11/12/21 1511  BP: (!) 119/55   Pulse: 63   Resp: 13 14  Temp: 36.6 C   SpO2: 100% 98%    Last Pain:  Vitals:   11/12/21 1511  TempSrc:   PainSc: 4                  Tiajuana Amass

## 2021-11-12 NOTE — H&P (Signed)
Meagan Kerr is an 51 y.o. female. Presenting initially for myomectomy.   We had several counseling sessions and she said she wants a hysterectomy .  She was thinking of trying to concieve again.  After discussing with her family she realizes that it is a small chance of that occurring and would like a hysterectomy  Pertinent Gynecological History: Menses: irregular occurring approximately every 14-21 days with spotting approximately 5-8 days per month Bleeding: dysfunctional uterine bleeding Contraception: none DES exposure: unknown Blood transfusions: none Sexually transmitted diseases: none Previous GYN Procedures:  CS times three   Last mammogram:  pt declined  Date:  Last pap: normal Date: 2023 OB History: G3, P2   Menstrual History: Menarche age: 70 Patient's last menstrual period was 10/27/2021 (exact date).    Past Medical History:  Diagnosis Date   Constipation    Fibroid    Palpitations 2001    Past Surgical History:  Procedure Laterality Date   CESAREAN SECTION     x 3   COLONOSCOPY  2022    History reviewed. No pertinent family history.  Social History:  reports that she has never smoked. She has never used smokeless tobacco. She reports that she does not drink alcohol and does not use drugs.  Allergies: No Known Allergies  Medications Prior to Admission  Medication Sig Dispense Refill Last Dose   Cholecalciferol (VITAMIN D3 PO) Take 1 capsule by mouth daily.   Past Week   POLY-IRON 150 150 MG capsule TAKE 1 CAPSULE(150 MG) BY MOUTH DAILY (Patient not taking: Reported on 11/04/2021) 30 capsule 3 Not Taking    Review of Systems  Blood pressure (!) 123/59, pulse 71, temperature 98.2 F (36.8 C), temperature source Oral, resp. rate 17, height 5\' 4"  (1.626 m), weight 81.6 kg, last menstrual period 10/27/2021, SpO2 100 %. Physical Exam Physical Examination: General appearance - alert, well appearing, and in no distress Mental status - alert, oriented to  person, place, and time Neck - supple, no significant adenopathy Chest - clear to auscultation, no wheezes, rales or rhonchi, symmetric air entry Heart - normal rate and regular rhythm Abdomen - soft, nontender, nondistended, no masses or organomegaly Uterus 25 week size.  Breasts - breasts appear normal, no suspicious masses, no skin or nipple changes or axillary nodes Pelvic - VULVA: normal appearing vulva with no masses, tenderness or lesions, VAGINA: normal appearing vagina with normal color and discharge, no lesions, CERVIX: normal appearing cervix without discharge or lesions, cervical stenosis present, UTERUS: enlarged to 25 week's size, mobile, ADNEXA: normal adnexa in size, nontender and no masses, RECTAL: normal rectal, no masses Rectal - normal rectal, no masses Musculoskeletal - no joint tenderness, deformity or swelling Extremities - Homan's sign negative bilaterally Skin - normal coloration and turgor, no rashes, no suspicious skin lesions noted   Results for orders placed or performed during the hospital encounter of 11/12/21 (from the past 24 hour(s))  Pregnancy, urine POC     Status: None   Collection Time: 11/12/21  8:20 AM  Result Value Ref Range   Preg Test, Ur NEGATIVE NEGATIVE  Basic Metabolic Panel     Status: Abnormal   Collection Time: 11/12/21  8:25 AM  Result Value Ref Range   Sodium 138 135 - 145 mmol/L   Potassium 3.9 3.5 - 5.1 mmol/L   Chloride 108 98 - 111 mmol/L   CO2 21 (L) 22 - 32 mmol/L   Glucose, Bld 83 70 - 99 mg/dL   BUN 15  6 - 20 mg/dL   Creatinine, Ser 0.67 0.44 - 1.00 mg/dL   Calcium 9.5 8.9 - 10.3 mg/dL   GFR, Estimated >60 >60 mL/min   Anion gap 9 5 - 15    No results found.  Assessment/Plan: Symptomatic Fibroids All treatments reviewed with the pt.  She has just decided on a hysterectomy.   Risks are but not limited to bleeding, infection damage to internal organs like bowel and bldder Pt was given a bowel prep EMBX was inconclusive   she declined repeating the procedure.  She understands that if cancer is found she may need antoher procedure.   Meagan Kerr A Kain Milosevic 11/12/2021, 9:01 AM

## 2021-11-12 NOTE — Anesthesia Procedure Notes (Signed)
Procedure Name: Intubation Date/Time: 11/12/2021 9:37 AM Performed by: Lowella Dell, CRNA Pre-anesthesia Checklist: Patient identified, Emergency Drugs available, Suction available, Patient being monitored and Timeout performed Patient Re-evaluated:Patient Re-evaluated prior to induction Oxygen Delivery Method: Circle System Utilized Preoxygenation: Pre-oxygenation with 100% oxygen Induction Type: IV induction Ventilation: Mask ventilation without difficulty and Oral airway inserted - appropriate to patient size Laryngoscope Size: Mac and 3 Grade View: Grade I Tube type: Oral Tube size: 7.0 mm Number of attempts: 1 Airway Equipment and Method: Stylet Placement Confirmation: ETT inserted through vocal cords under direct vision, positive ETCO2 and breath sounds checked- equal and bilateral Secured at: 22 cm Tube secured with: Tape Dental Injury: Teeth and Oropharynx as per pre-operative assessment

## 2021-11-13 ENCOUNTER — Encounter (HOSPITAL_COMMUNITY): Payer: Self-pay | Admitting: Obstetrics and Gynecology

## 2021-11-13 LAB — CBC
HCT: 19.8 % — ABNORMAL LOW (ref 36.0–46.0)
Hemoglobin: 6.7 g/dL — CL (ref 12.0–15.0)
MCH: 27.8 pg (ref 26.0–34.0)
MCHC: 33.8 g/dL (ref 30.0–36.0)
MCV: 82.2 fL (ref 80.0–100.0)
Platelets: 156 10*3/uL (ref 150–400)
RBC: 2.41 MIL/uL — ABNORMAL LOW (ref 3.87–5.11)
RDW: 16.1 % — ABNORMAL HIGH (ref 11.5–15.5)
WBC: 10.3 10*3/uL (ref 4.0–10.5)
nRBC: 0 % (ref 0.0–0.2)

## 2021-11-13 LAB — BASIC METABOLIC PANEL
Anion gap: 6 (ref 5–15)
BUN: 6 mg/dL (ref 6–20)
CO2: 22 mmol/L (ref 22–32)
Calcium: 8.2 mg/dL — ABNORMAL LOW (ref 8.9–10.3)
Chloride: 110 mmol/L (ref 98–111)
Creatinine, Ser: 0.68 mg/dL (ref 0.44–1.00)
GFR, Estimated: >60 mL/min (ref >60)
Glucose, Bld: 114 mg/dL — ABNORMAL HIGH (ref 70–99)
Potassium: 3.8 mmol/L (ref 3.5–5.1)
Sodium: 138 mmol/L (ref 135–145)

## 2021-11-13 LAB — CREATININE, FLUID (PLEURAL, PERITONEAL, JP DRAINAGE): Creat, Fluid: 0.8 mg/dL

## 2021-11-13 MED ORDER — IBUPROFEN 600 MG PO TABS
600.0000 mg | ORAL_TABLET | Freq: Four times a day (QID) | ORAL | Status: DC | PRN
  Administered 2021-11-14: 600 mg via ORAL
  Filled 2021-11-13: qty 1

## 2021-11-13 MED ORDER — OXYCODONE-ACETAMINOPHEN 5-325 MG PO TABS
1.0000 | ORAL_TABLET | Freq: Four times a day (QID) | ORAL | Status: DC | PRN
  Administered 2021-11-13 (×2): 2 via ORAL
  Administered 2021-11-13: 1 via ORAL
  Administered 2021-11-14: 2 via ORAL
  Filled 2021-11-13: qty 1
  Filled 2021-11-13 (×3): qty 2

## 2021-11-13 MED ORDER — METOCLOPRAMIDE HCL 10 MG PO TABS
10.0000 mg | ORAL_TABLET | Freq: Three times a day (TID) | ORAL | Status: DC
  Administered 2021-11-13 – 2021-11-14 (×2): 10 mg via ORAL
  Filled 2021-11-13 (×3): qty 1

## 2021-11-13 MED ORDER — SODIUM CHLORIDE 0.9 % IV SOLN
500.0000 mg | INTRAVENOUS | Status: DC
  Administered 2021-11-13: 500 mg via INTRAVENOUS
  Filled 2021-11-13: qty 25

## 2021-11-13 NOTE — Plan of Care (Signed)
°  Problem: Health Behavior/Discharge Planning: Goal: Ability to manage health-related needs will improve Outcome: Progressing   Problem: Clinical Measurements: Goal: Ability to maintain clinical measurements within normal limits will improve Outcome: Progressing Goal: Will remain free from infection Outcome: Progressing Goal: Diagnostic test results will improve Outcome: Progressing Goal: Respiratory complications will improve Outcome: Progressing Goal: Cardiovascular complication will be avoided Outcome: Progressing   Problem: Activity: Goal: Risk for activity intolerance will decrease Outcome: Progressing   Problem: Nutrition: Goal: Adequate nutrition will be maintained Outcome: Progressing   Problem: Elimination: Goal: Will not experience complications related to bowel motility Outcome: Progressing Goal: Will not experience complications related to urinary retention Outcome: Progressing   Problem: Pain Managment: Goal: General experience of comfort will improve Outcome: Progressing   Problem: Safety: Goal: Ability to remain free from injury will improve Outcome: Progressing   Problem: Skin Integrity: Goal: Risk for impaired skin integrity will decrease Outcome: Progressing   Problem: Education: Goal: Understanding of sexual limitations or changes related to disease process or condition will improve Outcome: Progressing Goal: Individualized Educational Video(s) Outcome: Progressing   Problem: Self-Concept: Goal: Communication of feelings regarding changes in body function or appearance will improve Outcome: Progressing   Problem: Skin Integrity: Goal: Demonstration of wound healing without infection will improve Outcome: Progressing

## 2021-11-13 NOTE — Progress Notes (Signed)
Date and time results received: 11/13/21 0547  (use smartphrase ".now" to insert current time)  Test: hgb Critical Value:   Latest Reference Range & Units 11/13/21 05:01  Hemoglobin 12.0 - 15.0 g/dL 6.7 (LL)  (LL): Data is critically low  Name of Provider Notified: Dr Charlesetta Garibaldi  Orders Received? Or Actions Taken?: no new orders.

## 2021-11-13 NOTE — Procedures (Signed)
Notified that nursing staff on this unit does not remove pelvic drains.  I happened to be at the hospital and within proximity.  Drain was removed at the patient's bedside without issue.  Dressed with 4 x 4 and Medipore tape.  No complications.  Bishop Limbo, MD [facility]

## 2021-11-13 NOTE — Progress Notes (Signed)
Patient ID: Meagan Kerr, female   DOB: [birth year], 51 y.o.   MRN: [id number]  Drain Cr 0.8.  Order placed to remove drain.  Will schedule outpatient follow up in 10-14 days for cystogram and voiding trial.

## 2021-11-13 NOTE — Progress Notes (Signed)
PCA dilaudid 1mg /59mL discontinued at 0918. 6mL wasted in stericycle in pyxis room A with Melina Fiddler, RN. Main pharmacy notified.

## 2021-11-13 NOTE — Progress Notes (Signed)
Patient ID: Meagan Kerr, female   DOB: [birth year], 51 y.o.   MRN: [id number]  1 Day Post-Op Subjective: Pt doing well overnight.  Incisional pain as expected.  Objective: Vital signs in last 24 hours: Temp:  [97.7 F (36.5 C)-99.7 F (37.6 C)] 99.7 F (37.6 C) (02/02 0322) Pulse Rate:  [60-79] 74 (02/02 0322) Resp:  [11-20] 20 (02/02 0322) BP: (90-130)/(41-61) 90/41 (02/02 0322) SpO2:  [96 %-100 %] 98 % (02/02 0541) Weight:  [81.6 kg] 81.6 kg (02/01 0733)  Intake/Output from previous day: 02/01 0701 - 02/02 0700 In: 6114.3 [P.O.:750; I.V.:4331.3; Blood:283; IV Piggyback:750] Out: 2440 [Urine:2025; Drains:192; Blood:1200] Intake/Output this shift: No intake/output data recorded.  Physical Exam:  General: Alert and oriented Abd: Dressing in place, serosanguinous drainage from drain with minimal output  Lab Results: Recent Labs    11/12/21 1143 11/12/21 1239 11/13/21 0501  HGB 7.8* 8.2* 6.7*  HCT 23.0* 24.0* 19.8*   BMET Recent Labs    11/12/21 0825 11/12/21 1143 11/12/21 1239 11/13/21 0501  NA 138   < > 139 138  K 3.9   < > 3.8 3.8  CL 108   < > 106 110  CO2 21*  --   --  22  GLUCOSE 83   < > 137* 114*  BUN 15   < > 11 6  CREATININE 0.67   < > 0.40* 0.68  CALCIUM 9.5  --   --  8.2*   < > = values in this interval not displayed.     Studies/Results: No results found.  Assessment/Plan: POD # 1 s/p repair of bladder injury at time of hysterectomy - Discussed bladder injury with patient and need for urethral catheter drainage after discharge.  Do not expect long term consequences. - Drain output minimal.  Will send drain Cr level this morning and will remove drain if c/w serum - I will arrange outpatient f/u in 10-14 days for cystogram and voiding trial   LOS: 1 day   Dutch Gray 11/13/2021, 7:11 AM

## 2021-11-14 LAB — TYPE AND SCREEN
ABO/RH(D): A POS
Antibody Screen: NEGATIVE
Unit division: 0
Unit division: 0

## 2021-11-14 LAB — BPAM RBC
Blood Product Expiration Date: 202302272359
Blood Product Expiration Date: 202302272359
ISSUE DATE / TIME: 202302011117
ISSUE DATE / TIME: 202302011117
Unit Type and Rh: 6200
Unit Type and Rh: 6200

## 2021-11-14 LAB — SURGICAL PATHOLOGY

## 2021-11-14 MED ORDER — OXYCODONE-ACETAMINOPHEN 5-325 MG PO TABS: 1.0000 | ORAL_TABLET | Freq: Four times a day (QID) | ORAL | 0 refills | Status: AC | PRN

## 2021-11-14 MED ORDER — METOCLOPRAMIDE HCL 10 MG PO TABS: 10.0000 mg | ORAL_TABLET | Freq: Three times a day (TID) | ORAL | 0 refills | Status: AC

## 2021-11-14 MED ORDER — IBUPROFEN 600 MG PO TABS: 600.0000 mg | ORAL_TABLET | Freq: Four times a day (QID) | ORAL | 0 refills | Status: AC | PRN

## 2021-11-14 MED ORDER — CIPROFLOXACIN HCL 500 MG PO TABS: 500.0000 mg | ORAL_TABLET | Freq: Two times a day (BID) | ORAL | 0 refills | Status: AC

## 2021-11-14 MED ORDER — POLYETHYLENE GLYCOL 3350 17 GM/SCOOP PO POWD: 1.0000 | Freq: Once | ORAL | 0 refills | Status: AC

## 2021-11-14 MED ORDER — ONDANSETRON HCL 4 MG PO TABS: 4.0000 mg | ORAL_TABLET | Freq: Four times a day (QID) | ORAL | 0 refills | Status: AC | PRN

## 2021-11-14 NOTE — Discharge Summary (Signed)
Physician Discharge Summary  Patient ID: Meagan Kerr MRN: [id number] DOB/AGE: 04/16/1971 51 y.o.  Admit date: 11/12/2021 Discharge date: 11/14/2021  Admission Diagnoses:  Discharge Diagnoses:  Principal Problem:   S/P abdominal hysterectomy Active Problems:   Leiomyoma of uterus   Discharged Condition: good  Hospital Course: Pt underwent TAH B salpingectomy and repair of bladder laceration.  She is stable and ready for discharge.  She is tolerating PO.  She will take reglan and miralax until BM is regular.  Her urine output is excellent and urine is clear.  Will go home with a leg bag, and follow up with Urology in 10 14 days .  She will have a cystogram and if healed the foley will be removed.  She will go home with written and verbal instructions.  She is anemic but asymptomatic and refused another two units of blood she was given IV iron.    Consults: urology  Significant Diagnostic Studies: see chart  Treatments: IV hydration, antibiotics: Ancef, and surgery: TAH b salpinecttomy.  LOA and bladder repair.    Discharge Exam: Physical Examination: General appearance - alert, well appearing, and in no distress Abdomen - soft, nontender, nondistended, no masses or organomegaly Pelvic - occ vaginal bleeding Extremities - Homan's sign negative bilaterally   Disposition: Discharge disposition: 01-Home or Self Care        Allergies as of 11/14/2021   No Known Allergies      Medication List     STOP taking these medications    Poly-Iron 150 150 MG capsule Generic drug: iron polysaccharides       TAKE these medications    ciprofloxacin 500 MG tablet Commonly known as: Cipro Take 1 tablet (500 mg total) by mouth 2 (two) times daily for 5 days.   ibuprofen 600 MG tablet Commonly known as: ADVIL Take 1 tablet (600 mg total) by mouth every 6 (six) hours as needed for mild pain.   metoCLOPramide 10 MG tablet Commonly known as: REGLAN Take 1 tablet (10 mg total) by  mouth 4 (four) times daily -  before meals and at bedtime for 7 days.   ondansetron 4 MG tablet Commonly known as: ZOFRAN Take 1 tablet (4 mg total) by mouth every 6 (six) hours as needed for nausea.   oxyCODONE-acetaminophen 5-325 MG tablet Commonly known as: PERCOCET/ROXICET Take 1-2 tablets by mouth every 6 (six) hours as needed for moderate pain.   polyethylene glycol powder 17 GM/SCOOP powder Commonly known as: GLYCOLAX/MIRALAX Take 255 g by mouth once for 1 dose.   VITAMIN D3 PO Take 1 capsule by mouth daily.        Follow-up Information     Erubiel Manasco, MD Follow up in 1 week(s).   Specialty: Obstetrics and Gynecology Why: Th appointment Contact information: [address] [place] [address] [phone]         Raynelle Bring, MD. Schedule an appointment as soon as possible for a visit.   Specialty: Urology Why: make an appoitnment in 10-14 days Contact information: [place] [address] [phone]         [place], [place] .   Specialty: General Surgery Contact information: [place] [address] [phone]                 Signed: Franklyn Lor Kule Gascoigne 11/14/2021, 9:39 AM

## 2021-11-14 NOTE — Progress Notes (Signed)
Pt noted to void into indwelling catheter as well as externally around it. Catheter flushed with 20 cc saline per urology on call. Bladder scan reveals 56ml. Oxybutynin 5 mg BID recommended per Urology.  Dr. Charlesetta Garibaldi notified to update discharge orders.

## 2021-11-14 NOTE — Progress Notes (Signed)
1 Procedure(s): HYSTERECTOMY ABDOMINALB SALPINGECTOMY LOA BLADDER REPAIR LATE ENTRY FOR 2/2 AT 9 AM  Subjective: Patient reports incisional pain and tolerating PO.    Objective: .VSS  General: alert and cooperative Cardio: regular rate and rhythm GI: soft, non-tender; bowel sounds normal; no masses,  no organomegaly and incision: clean and dry Extremities: Homans sign is negative, no sign of DVT Vaginal Bleeding: none  Assessment: s/p Procedure(s): HYSTERECTOMY ABDOMINAL BLADDER REPAIR: stable and anemia  Plan: Encourage ambulation Continue foley due to strict I&O and BLADDER REPAIR AWAITING CREATININE LEVEL PT DCLINES BLOOD TRANSFUSION.   IF SHE BEOMES SYMPTOMATIC PLAN BLOOD WILL GIVE IV IRON INFUSION MONITOR CLOSELY TOLERATING PO.  HEPLOC IV , DC PCA AND GIVE PO PAIN MEDS TENTATIVE DC 2/3  LOS: 2 days    Orlin Kann A Di Jasmer 11/14/2021, 8:18 AM

## 2021-11-17 ENCOUNTER — Other Ambulatory Visit (HOSPITAL_COMMUNITY): Payer: Self-pay | Admitting: Urology

## 2021-11-17 DIAGNOSIS — S3729XD Other injury of bladder, subsequent encounter: Secondary | ICD-10-CM

## 2021-11-24 ENCOUNTER — Other Ambulatory Visit: Payer: Self-pay

## 2021-11-24 ENCOUNTER — Ambulatory Visit (HOSPITAL_COMMUNITY)
Admission: RE | Admit: 2021-11-24 | Discharge: 2021-11-24 | Disposition: A | Discharge status: Home / Self Care | Payer: BC Managed Care – PPO | Source: Ambulatory Visit | Attending: Urology | Admitting: Urology

## 2021-11-24 DIAGNOSIS — S3729XD Other injury of bladder, subsequent encounter: Secondary | ICD-10-CM

## 2021-11-24 MED ORDER — IOTHALAMATE MEGLUMINE 17.2 % UR SOLN: 250.0000 mL | Freq: Once | URETHRAL | Status: DC | PRN

## 2022-09-07 IMAGING — CR DG CYSTOGRAM 3+V
8 series · 8 of 8 positions shown · non-contrast
Comparison: None.

CLINICAL DATA: Status post bladder repair.

EXAM:
CYSTOGRAM
TECHNIQUE: Through pre-existing Foley catheter, the bladder was filled with 250
mL Cysto-Hypaque 30% by drip infusion. Serial spot images were
obtained during bladder filling and post draining.
FLUOROSCOPY:
Radiation Exposure Index (as provided by the fluoroscopic device):
11.1 mGy Kerma

[t abdomen supine]
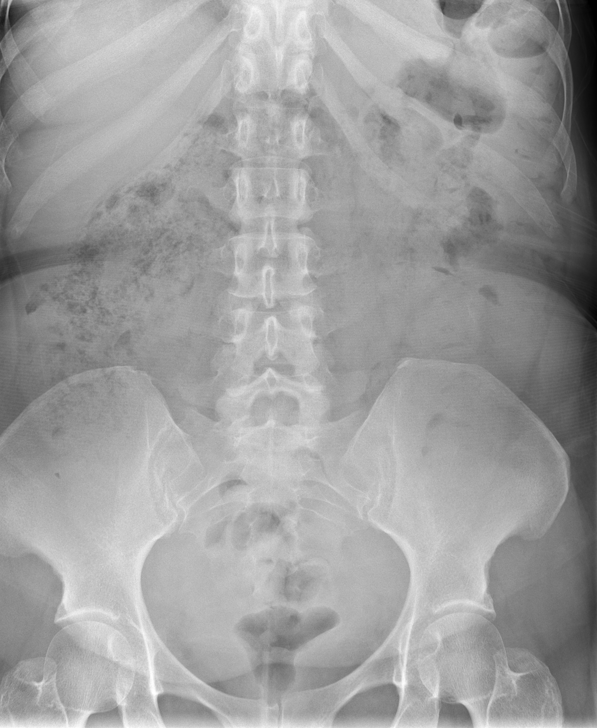

[cp_standard (1 of 6)]
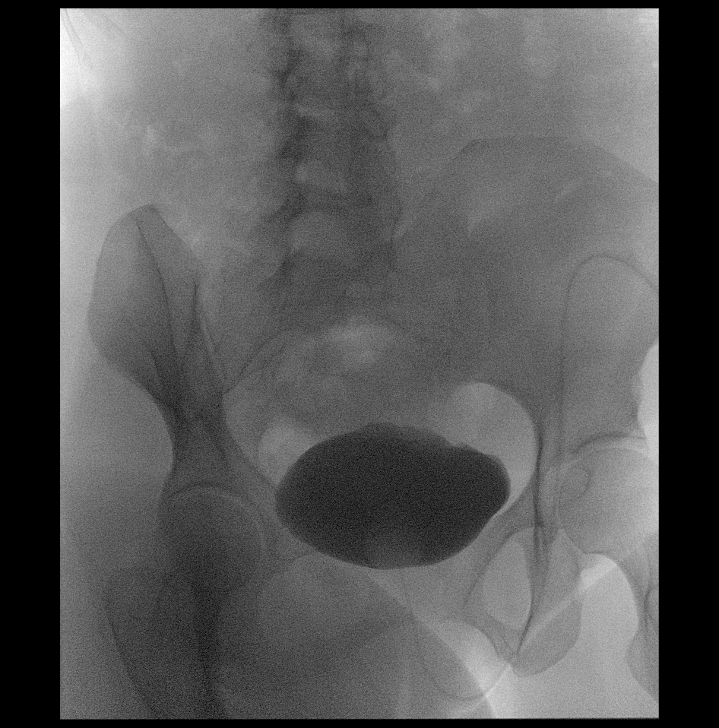

[cp_standard (2 of 6)]
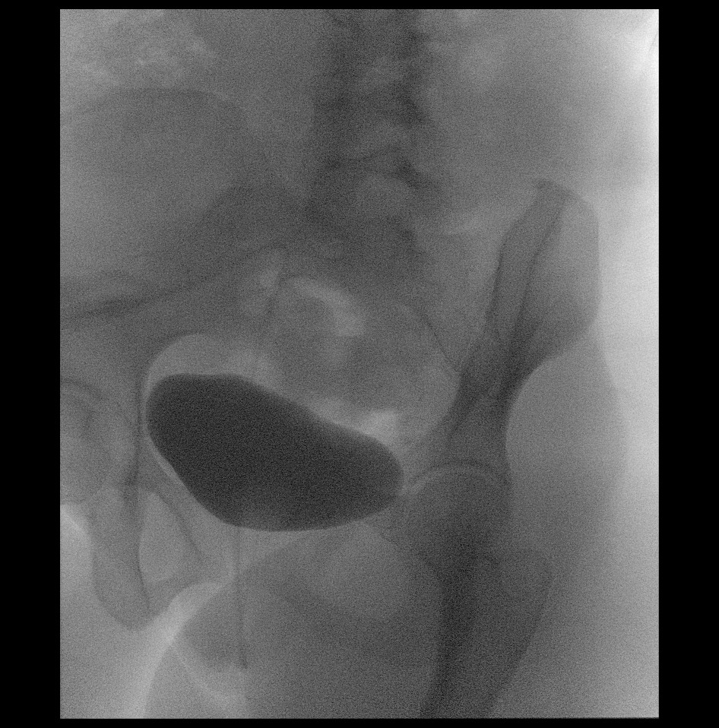

[cp_standard (3 of 6)]
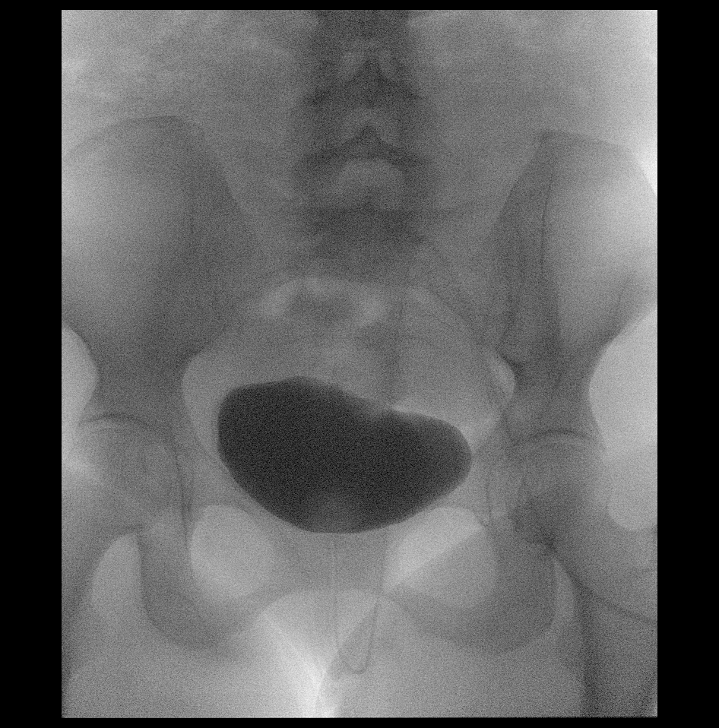

[fluoro_iodine_singleshot_bw]
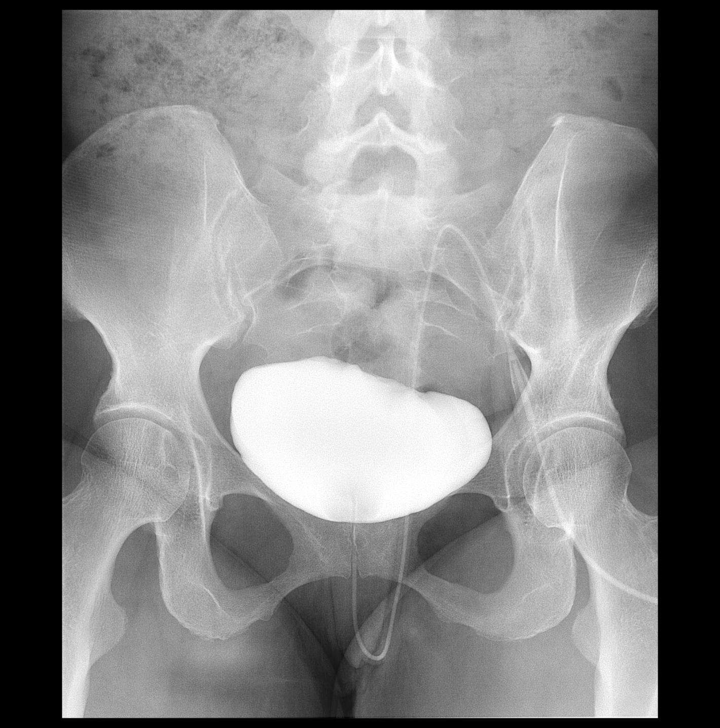

[cp_standard (4 of 6)]
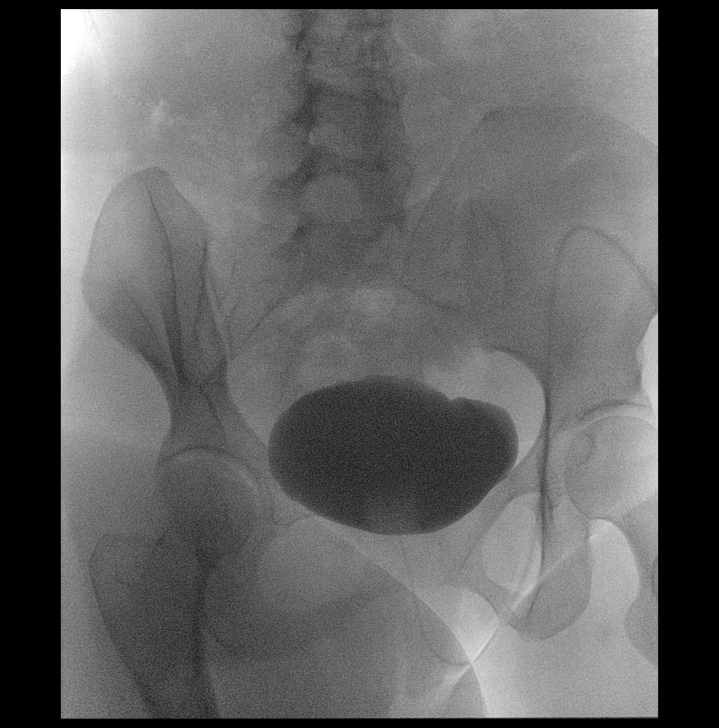

[cp_standard (5 of 6)]
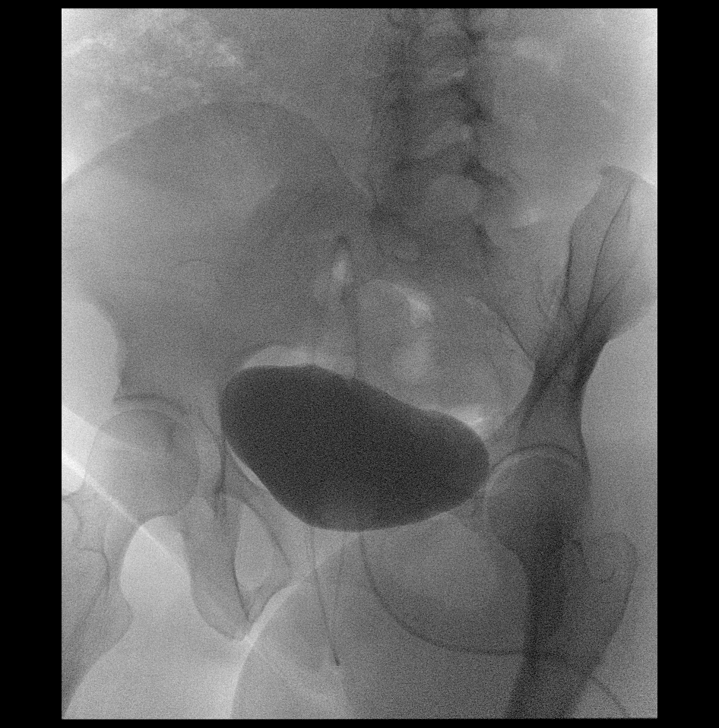

[cp_standard (6 of 6)]
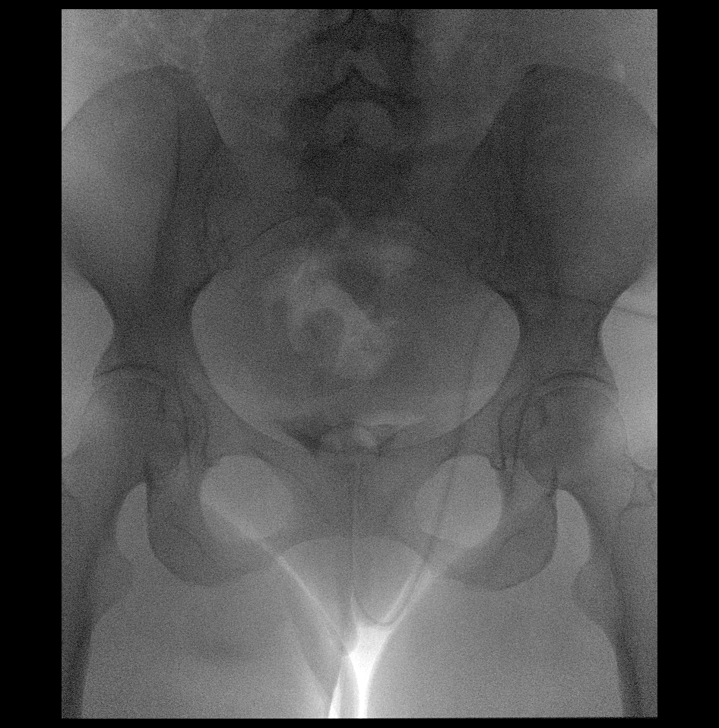

[8 of 8 positions shown; findings below may reference images not displayed]

FINDINGS: The bladder appears to be normal in size and contour. No definite
extravasation or leakage is noted. Minimal residual contrast is
noted on the postvoid exam.
IMPRESSION: Normal filling of urinary bladder is noted. No definite
extravasation or leakage is noted.
